# Patient Record
Sex: Male | Born: 1985 | Hispanic: No | Marital: Single | Smoking: Current every day smoker
Health system: Southern US, Community
[De-identification: ages and names within clinical notes are randomized; demographics above are authoritative.]

## PROBLEM LIST (undated history)

## (undated) DIAGNOSIS — F1020 Alcohol dependence, uncomplicated: Secondary | ICD-10-CM

## (undated) DIAGNOSIS — F419 Anxiety disorder, unspecified: Secondary | ICD-10-CM

## (undated) DIAGNOSIS — E785 Hyperlipidemia, unspecified: Secondary | ICD-10-CM

## (undated) HISTORY — DX: Anxiety disorder, unspecified: F41.9

## (undated) HISTORY — DX: Alcohol dependence, uncomplicated: F10.20

## (undated) HISTORY — DX: Hyperlipidemia, unspecified: E78.5

---

## 2006-12-17 ENCOUNTER — Emergency Department (HOSPITAL_COMMUNITY): Admission: AC | Admit: 2006-12-17 | Discharge: 2006-12-17 | Payer: Self-pay

## 2007-09-22 ENCOUNTER — Emergency Department (HOSPITAL_COMMUNITY): Admission: EM | Admit: 2007-09-22 | Discharge: 2007-09-22 | Payer: Self-pay | Admitting: Emergency Medicine

## 2008-10-05 ENCOUNTER — Emergency Department (HOSPITAL_COMMUNITY): Admission: EM | Admit: 2008-10-05 | Discharge: 2008-10-05 | Payer: Self-pay | Admitting: Emergency Medicine
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
## 2010-10-11 LAB — RAPID STREP SCREEN (MED CTR MEBANE ONLY): Streptococcus, Group A Screen (Direct): NEGATIVE

## 2010-10-16 LAB — CBC
HCT: 41.7
Hemoglobin: 14.9
Platelets: 280
WBC: 11.4 — ABNORMAL HIGH

## 2010-10-16 LAB — I-STAT 8, (EC8 V) (CONVERTED LAB)
Acid-Base Excess: 3 — ABNORMAL HIGH
BUN: 9
Chloride: 103
HCT: 47
Hemoglobin: 16
Operator id: 294341
pH, Ven: 7.443 — ABNORMAL HIGH

## 2010-10-16 LAB — POCT I-STAT CREATININE: Creatinine, Ser: 1.2

## 2010-10-16 LAB — PROTIME-INR: Prothrombin Time: 13
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
## 2015-05-01 ENCOUNTER — Emergency Department (HOSPITAL_COMMUNITY)
Admission: EM | Admit: 2015-05-01 | Discharge: 2015-05-01 | Disposition: A | Discharge status: Home / Self Care | Payer: BLUE CROSS/BLUE SHIELD | Attending: Emergency Medicine | Admitting: Emergency Medicine

## 2015-05-01 ENCOUNTER — Encounter (HOSPITAL_COMMUNITY): Payer: Self-pay | Admitting: Emergency Medicine

## 2015-05-01 ENCOUNTER — Emergency Department (HOSPITAL_COMMUNITY): Payer: BLUE CROSS/BLUE SHIELD

## 2015-05-01 DIAGNOSIS — R079 Chest pain, unspecified: Secondary | ICD-10-CM | POA: Diagnosis not present

## 2015-05-01 DIAGNOSIS — F17210 Nicotine dependence, cigarettes, uncomplicated: Secondary | ICD-10-CM | POA: Diagnosis not present

## 2015-05-01 DIAGNOSIS — Z79899 Other long term (current) drug therapy: Secondary | ICD-10-CM | POA: Insufficient documentation

## 2015-05-01 DIAGNOSIS — R0602 Shortness of breath: Secondary | ICD-10-CM | POA: Diagnosis not present

## 2015-05-01 MED ORDER — NAPROXEN 500 MG PO TABS: 500.0000 mg | ORAL_TABLET | Freq: Two times a day (BID) | ORAL | Status: DC

## 2015-05-01 NOTE — ED Provider Notes (Signed)
CSN: [id number]     Arrival date & time 05/01/15  1121 History   First MD Initiated Contact with Patient 05/01/15 1209     Chief Complaint  Patient presents with  . Cough   The history is provided by the patient. No language interpreter was used.    HPI Comments: Charles Macias is a 30 y.o. male who presents to the Emergency Department with no PMHx complaining of right anterior chest pain radiating to the central chest for the past several days. Pt also reports mild SOB. He states his pain is mild is severity and is worse with cough and deep inspiration. Pt has tried no OTC medications for pain.  Pt denies any fevers, chills, diaphoresis, N/V/D or any other symptoms at this time. He drinks alcohol and smokes cigarettes.   History reviewed. No pertinent past medical history. History reviewed. No pertinent past surgical history. No family history on file. Social History  Substance Use Topics  . Smoking status: Current Every Day Smoker -- 0.50 packs/day    Types: Cigarettes  . Smokeless tobacco: None  . Alcohol Use: No    Review of Systems  Constitutional: Negative for fever, chills and diaphoresis.  Respiratory: Positive for shortness of breath.   Cardiovascular: Positive for chest pain.  Gastrointestinal: Negative for nausea, vomiting and diarrhea.  All other systems reviewed and are negative.  Allergies  Review of patient's allergies indicates no known allergies.  Home Medications   Prior to Admission medications   Medication Sig Start Date End Date Taking? Authorizing Provider  cephALEXin (KEFLEX) 500 MG capsule Take 1 capsule by mouth 4 (four) times daily. Starting 04/27/2015 x 7 days for wound infection. 04/27/15  Yes Historical Provider, MD  silver sulfADIAZINE (SILVADENE) 1 % cream Apply 1 application topically 2 (two) times daily. Starting 04/27/2015 x 10 days for wounds. 04/27/15  Yes Historical Provider, MD  traMADol-acetaminophen (ULTRACET) 37.5-325 MG tablet Take 2  tablets by mouth 4 (four) times daily. Starting 04/27/2015 x 4 days. 04/27/15  Yes Historical Provider, MD  naproxen (NAPROSYN) 500 MG tablet Take 1 tablet (500 mg total) by mouth 2 (two) times daily. 05/01/15   Donnetta HutchingBrian Mckaylee Dimalanta, MD   BP 151/100 mmHg  Pulse 90  Temp(Src) 98.3 F (36.8 C) (Oral)  Resp 18  Ht 5\' 11"  (1.803 m)  Wt 224 lb (101.606 kg)  BMI 31.26 kg/m2  SpO2 100%   Physical Exam  Constitutional: He is oriented to person, place, and time. He appears well-developed and well-nourished.  HENT:  Head: Normocephalic and atraumatic.  Eyes: Conjunctivae and EOM are normal. Pupils are equal, round, and reactive to light.  Neck: Normal range of motion. Neck supple.  Cardiovascular: Normal rate and regular rhythm.   Pulmonary/Chest: Effort normal and breath sounds normal.  Abdominal: Soft. Bowel sounds are normal.  Musculoskeletal: Normal range of motion.  Neurological: He is alert and oriented to person, place, and time.  Skin: Skin is warm and dry.  Psychiatric: He has a normal mood and affect. His behavior is normal.  Nursing note and vitals reviewed.   ED Course  Procedures  DIAGNOSTIC STUDIES: Oxygen Saturation is 100% on room air, normal by my interpretation.    COORDINATION OF CARE: 12:56 PM Will order CXR and EKG for low risk ACS and PE. Discussed treatment plan with pt at bedside and pt agreed to plan.  Labs Review Labs Reviewed - No data to display  Imaging Review Dg Chest 2 View  05/01/2015  CLINICAL  DATA:  Cough x3 days, right chest tightness EXAM: CHEST  2 VIEW COMPARISON:  10/05/2008 FINDINGS: Lungs are clear.  No pleural effusion or pneumothorax. The heart is normal in size. Visualized osseous structures are within normal limits. IMPRESSION: No evidence of acute cardiopulmonary disease. Electronically Signed   By: Charline Bills M.D.   On: 05/01/2015 12:04   I have personally reviewed and evaluated these images and lab results as part of my medical  decision-making.   EKG Interpretation   Date/Time:  Sunday May 01 2015 13:14:31 EDT Ventricular Rate:  83 PR Interval:  180 QRS Duration: 85 QT Interval:  369 QTC Calculation: 434 R Axis:   61 Text Interpretation:  Sinus rhythm ST elev, probable normal early repol  pattern Confirmed by Lachrisha Ziebarth  MD, Catherene Kaleta ([id number]) on 05/01/2015 1:56:59 PM      MDM   Final diagnoses:  Chest pain, unspecified chest pain type    Patient is low risk for ACS or PE. EKG and chest x-ray negative. Vital signs stable. Rx Naprosyn 500 mg.  I personally performed the services described in this documentation, which was scribed in my presence. The recorded information has been reviewed and is accurate.     Donnetta Hutching, MD 05/01/15 1359

## 2015-05-01 NOTE — Progress Notes (Signed)
Discharged PT per MD order and protocol. Discharge handouts reviewed/explained. Education completed.  Pt verbalized understanding and left with all belongings. VSS. IV catheter D/C.  Patient wheeled down by staff member.  

## 2015-05-01 NOTE — Discharge Instructions (Signed)
Tests showed no life-threatening condition. Medication for pain. Return if worse.

## 2015-05-01 NOTE — ED Notes (Addendum)
Pt reports non-productive cough, chest congestion/tightness, and mild SOB. Denies fever/chills. Pt states he saw PCP on Wednesday and has been taking antibiotic. States symptoms have worsened.

## 2017-03-07 ENCOUNTER — Encounter: Payer: Self-pay | Admitting: Cardiovascular Disease

## 2017-03-13 ENCOUNTER — Encounter: Payer: Self-pay | Admitting: Cardiovascular Disease

## 2017-03-13 ENCOUNTER — Ambulatory Visit (INDEPENDENT_AMBULATORY_CARE_PROVIDER_SITE_OTHER): Payer: BLUE CROSS/BLUE SHIELD | Admitting: Cardiovascular Disease

## 2017-03-13 VITALS — BP 144/86 | HR 94 | Ht 71.0 in | Wt 235.0 lb

## 2017-03-13 DIAGNOSIS — Z716 Tobacco abuse counseling: Secondary | ICD-10-CM

## 2017-03-13 DIAGNOSIS — Z7182 Exercise counseling: Secondary | ICD-10-CM | POA: Diagnosis not present

## 2017-03-13 DIAGNOSIS — Z136 Encounter for screening for cardiovascular disorders: Secondary | ICD-10-CM

## 2017-03-13 DIAGNOSIS — R030 Elevated blood-pressure reading, without diagnosis of hypertension: Secondary | ICD-10-CM | POA: Diagnosis not present

## 2017-03-13 MED ORDER — VARENICLINE TARTRATE 0.5 MG X 11 & 1 MG X 42 PO MISC: ORAL | 0 refills | Status: DC

## 2017-03-13 NOTE — Progress Notes (Signed)
CARDIOLOGY CONSULT NOTE  Patient ID: Charles Macias MRN: [id number] DOB/AGE: [birth year] 32 y.o.  Admit date: (Not on file) Primary Physician: Ginger Organ Referring Physician: Ginger Organ  Reason for Consultation: Family history of heart disease  HPI: Charles Macias is a 32 y.o. male who is being seen today for the evaluation of family history of heart disease at the request of Cory Munch, PA-C.   I reviewed office notes.  It appears his father has a history of MI with stent and CABG by the age of 63.  I reviewed labs performed on 02/18/17: Hemoglobin 15.1, BUN 10, creatinine 0.86, sodium 137, potassium 4.9, total cholesterol 201, triglycerides 55, HDL 64, LDL 126.  He is not sure what his blood pressure normally runs but it is mildly elevated at 144/86 today.  He smokes 1/2 pack of cigarettes daily and has done so for at least 10 years.  He said he wants to begin exercising but he has been afraid to do so.  He is engaged and his fiance cooks and purchases very healthy food including chicken, fish, and fresh fruit and vegetables.  He very seldom has episodic chest pains at rest lasting for a second.  He has occasional exertional dyspnea.  He denies palpitations, leg swelling, orthopnea, paroxysmal nocturnal dyspnea, lightheadedness, dizziness, and syncope.  I ordered an ECG which I personally reviewed today in the office which demonstrates sinus rhythm with no ischemic ST segment or T wave abnormalities, nor any arrhythmias.  There is voltage criteria for LVH.  Family history: Father had 2 MIs and ultimately had a stent and underwent CABG at the age of 11.  He was a non-smoker.  His paternal grandfather had an MI at age 76 and is currently 27.    No Known Allergies  Current Outpatient Medications  Medication Sig Dispense Refill  . varenicline (CHANTIX STARTING MONTH PAK) 0.5 MG X 11 & 1 MG X 42 tablet Take one 0.5 mg tablet by mouth  once daily for 3 days, then increase to one 0.5 mg tablet twice daily for 4 days, then increase to one 1 mg tablet twice daily. 53 tablet 0   No current facility-administered medications for this visit.     History reviewed. No pertinent past medical history.  History reviewed. No pertinent surgical history.  Social History   Socioeconomic History  . Marital status: Single    Spouse name: Not on file  . Number of children: Not on file  . Years of education: Not on file  . Highest education level: Not on file  Social Needs  . Financial resource strain: Not on file  . Food insecurity - worry: Not on file  . Food insecurity - inability: Not on file  . Transportation needs - medical: Not on file  . Transportation needs - non-medical: Not on file  Occupational History  . Not on file  Tobacco Use  . Smoking status: Current Every Day Smoker    Packs/day: 0.50    Types: Cigarettes  . Smokeless tobacco: Never Used  Substance and Sexual Activity  . Alcohol use: No  . Drug use: No  . Sexual activity: Not on file  Other Topics Concern  . Not on file  Social History Narrative  . Not on file     No outpatient medications have been marked as taking for the 03/13/17 encounter (Office Visit) with Herminio Commons, MD.  Review of systems complete and found to be negative unless listed above in HPI    Physical exam Blood pressure (!) 144/86, pulse 94, height '5\' 11"'$  (1.803 m), weight 235 lb (106.6 kg), SpO2 96 %. General: NAD Neck: No JVD, no thyromegaly or thyroid nodule.  Lungs: Clear to auscultation bilaterally with normal respiratory effort. CV: Nondisplaced PMI. Regular rate and rhythm, normal S1/S2, no S3/S4, no murmur.  No peripheral edema.  No carotid bruit.    Abdomen: Soft, nontender, no distention.  Skin: Intact without lesions or rashes.  Neurologic: Alert and oriented x 3.  Psych: Normal affect. Extremities: No clubbing or cyanosis.  HEENT: Normal.   ECG:  Most recent ECG reviewed.   Labs: Lab Results  Component Value Date/Time   K 3.1 (L) 12/17/2006 12:21 AM   BUN 9 12/17/2006 12:21 AM   CREATININE 1.2 12/17/2006 12:21 AM   HGB 16.0 12/17/2006 12:21 AM     Lipids: No results found for: LDLCALC, LDLDIRECT, CHOL, TRIG, HDL      ASSESSMENT AND PLAN:  1.  Primary prevention of cardiovascular disease: We spoke at length about exercise modification and tobacco cessation.  I also told him to monitor his blood pressure as it is elevated today and if it remains consistently elevated even after lifestyle modification for at least 3 months, he may require antihypertensive therapy.  His lipids have been reviewed above and at present, he does not require statin therapy as LDL is less than 190.  It appears he eats a very healthy diet.  2.  Elevated blood pressure without a diagnosis of hypertension:  I told him to monitor his blood pressure as it is elevated today and if it remains consistently elevated even after lifestyle modification for at least 3 months, he may require antihypertensive therapy.  If this occurs, I would consider echocardiography to evaluate for hypertensive heart disease given the possible LVH seen by ECG.  3.  Tobacco use: Cessation counseling provided (3 minutes).  I will prescribe a Chantix starter kit.     Disposition: Follow up prn.   Signed: Kate Sable, M.D., F.A.C.C.  03/13/2017, 2:54 PM

## 2017-03-13 NOTE — Patient Instructions (Signed)
Medication Instructions:  START CHANTIX - TAKE AS DIRECTED   Labwork: NONE  Testing/Procedures: NONE  Follow-Up: Your physician recommends that you schedule a follow-up appointment in: AS NEEDED    Any Other Special Instructions Will Be Listed Below (If Applicable).     If you need a refill on your cardiac medications before your next appointment, please call your pharmacy.

## 2017-04-08 ENCOUNTER — Other Ambulatory Visit: Payer: Self-pay | Admitting: Cardiovascular Disease

## 2017-04-16 ENCOUNTER — Other Ambulatory Visit: Payer: Self-pay

## 2017-04-16 MED ORDER — VARENICLINE TARTRATE 1 MG PO TABS: 1.0000 mg | ORAL_TABLET | Freq: Two times a day (BID) | ORAL | 2 refills | Status: DC

## 2017-04-16 NOTE — Telephone Encounter (Signed)
refilled continuing month chantix 1 mg BID 30 day with RF:2

## 2017-12-18 IMAGING — DX DG CHEST 2V
2 series · 3 of 3 positions shown · non-contrast
Comparison: 10/05/2008

CLINICAL DATA: Cough x3 days, right chest tightness

EXAM:
CHEST  2 VIEW

[chest pa]
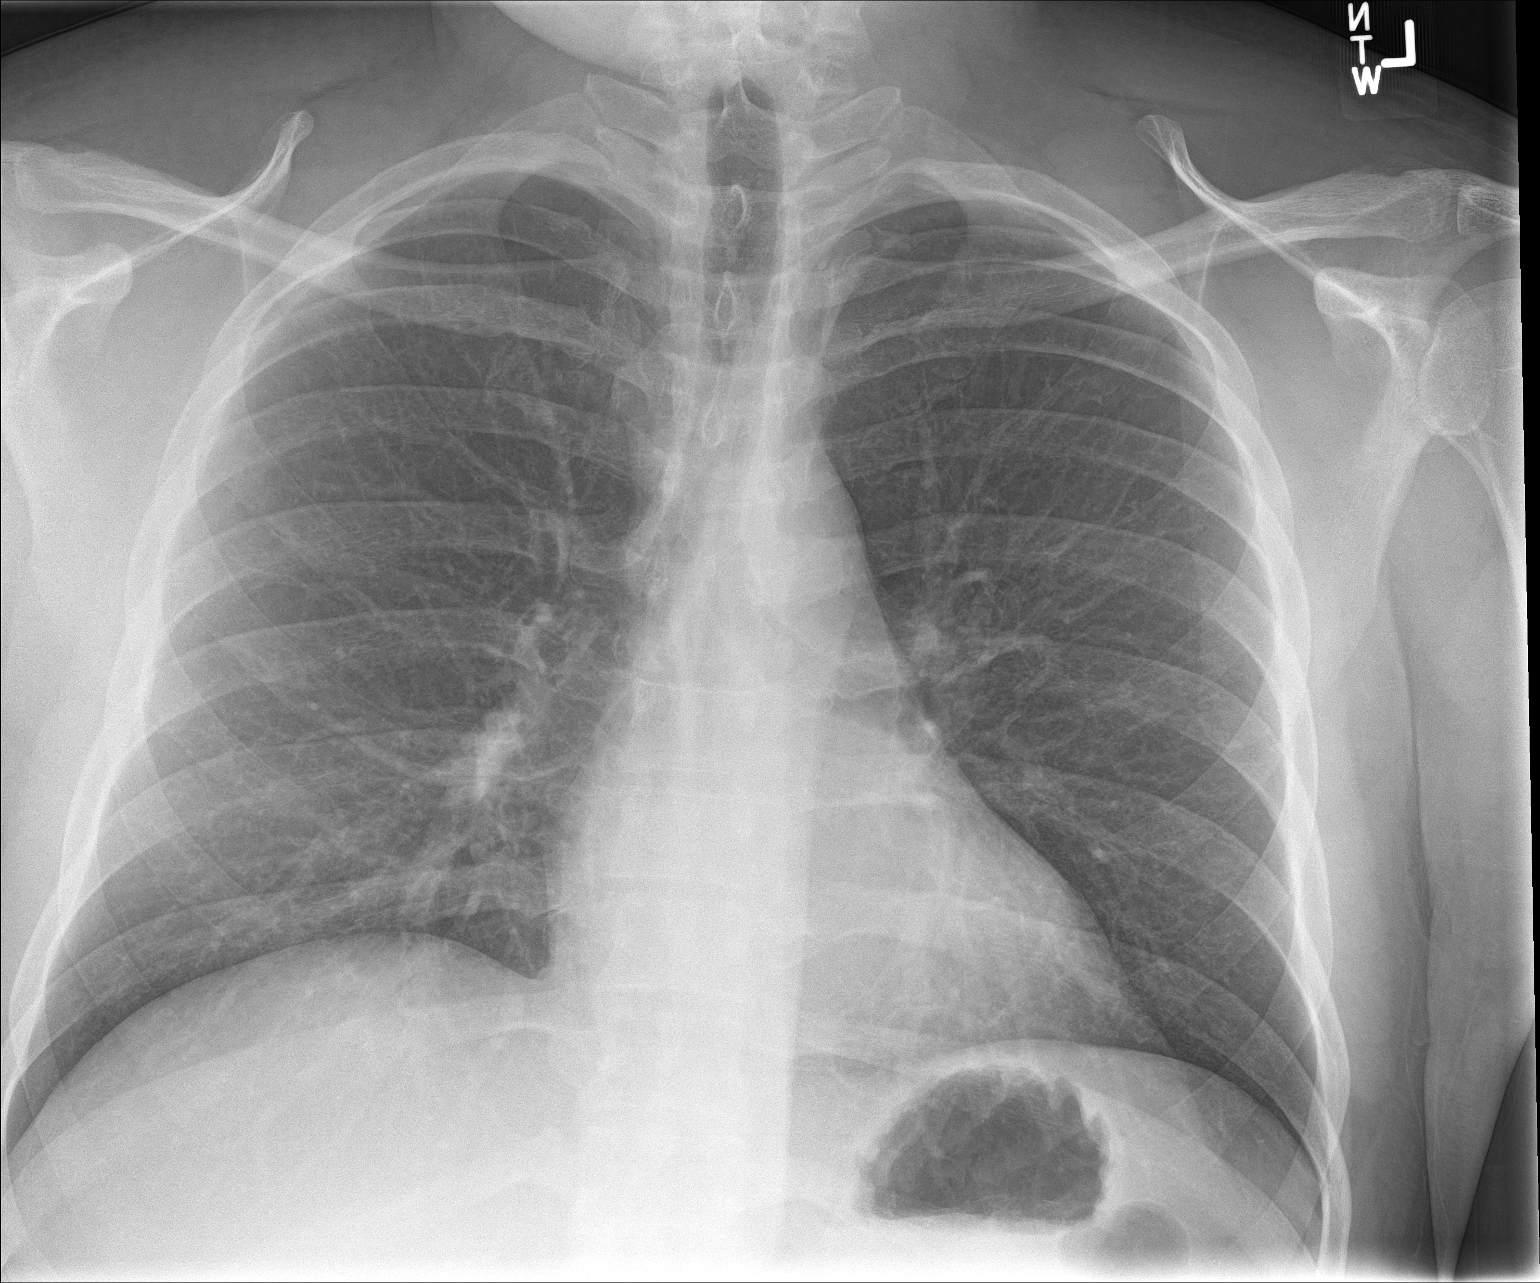

[Series 2: chest lat · 0.14mm/px · 2 of 2 slices shown]
[im 1/2]
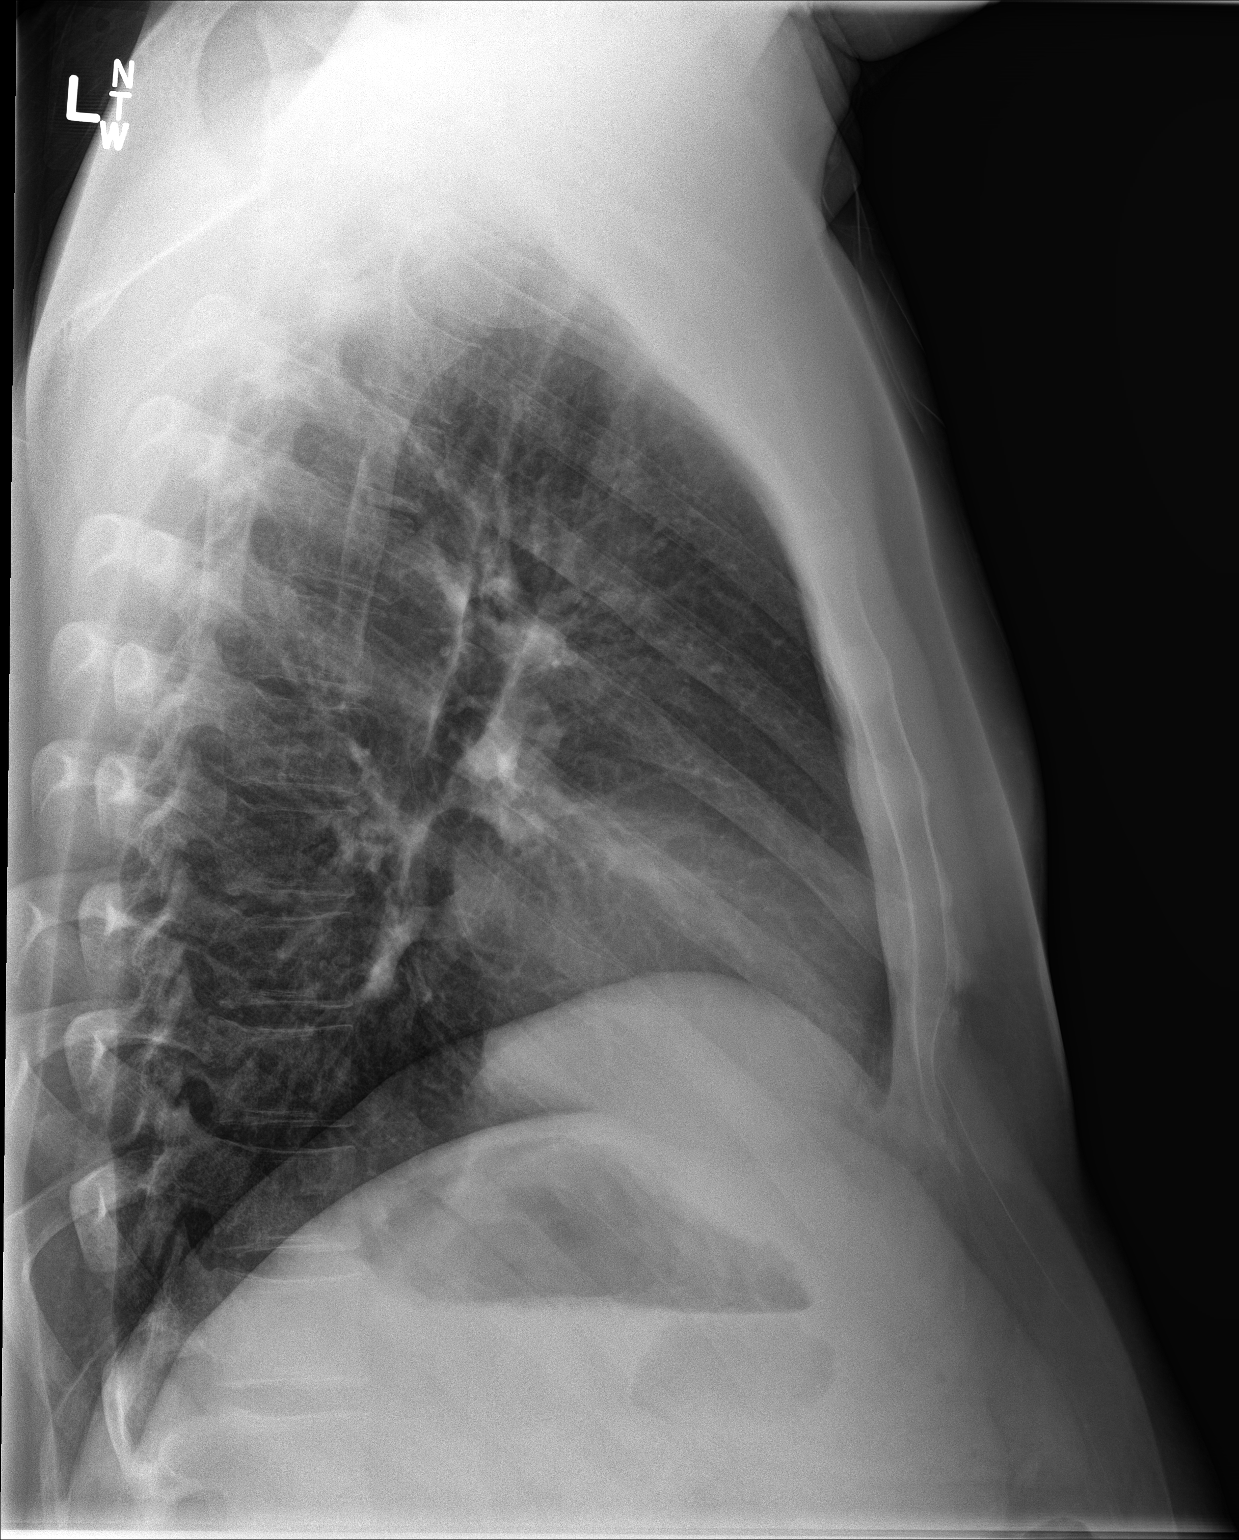
[im 2/2]
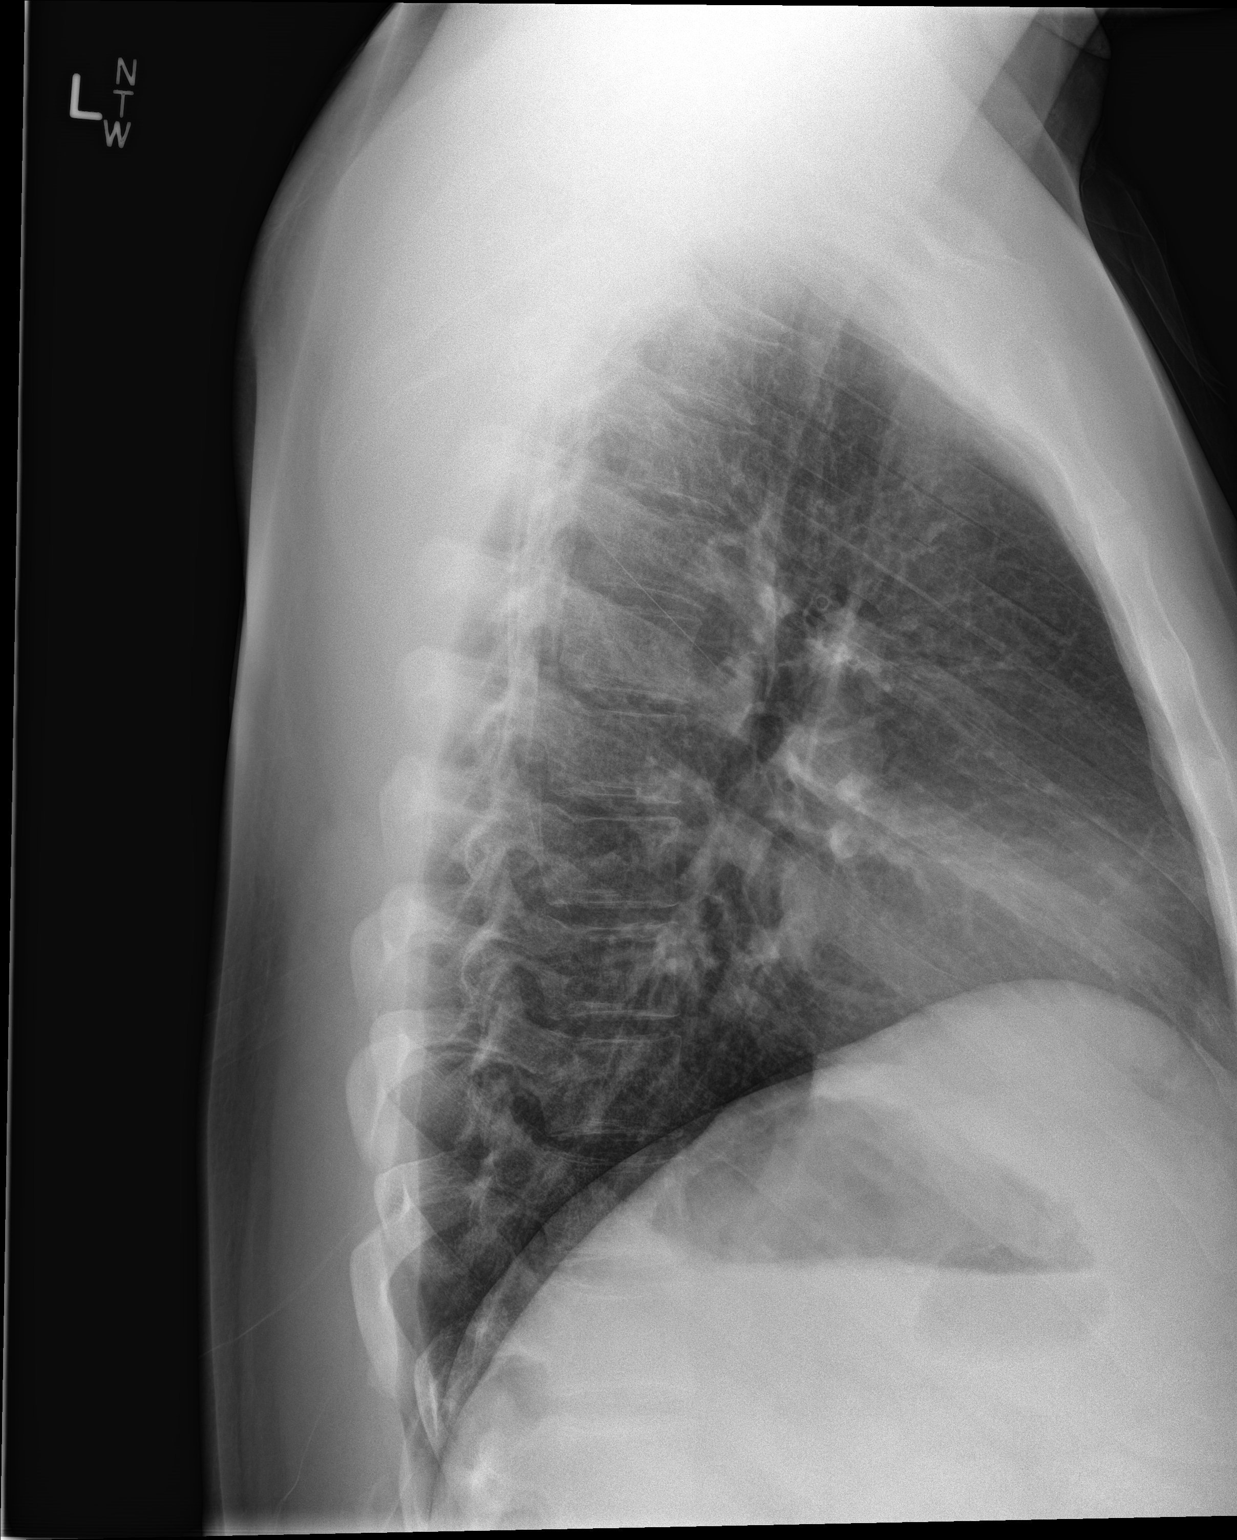

[3 of 3 positions shown; findings below may reference images not displayed]

FINDINGS: Lungs are clear.  No pleural effusion or pneumothorax.

The heart is normal in size.

Visualized osseous structures are within normal limits.
IMPRESSION: No evidence of acute cardiopulmonary disease.

## 2019-08-29 ENCOUNTER — Other Ambulatory Visit: Payer: Self-pay

## 2019-08-29 ENCOUNTER — Encounter (HOSPITAL_COMMUNITY): Payer: Self-pay

## 2019-08-29 ENCOUNTER — Emergency Department (HOSPITAL_COMMUNITY)
Admission: EM | Admit: 2019-08-29 | Discharge: 2019-08-29 | Disposition: A | Discharge status: Home / Self Care | Payer: BLUE CROSS/BLUE SHIELD | Attending: Emergency Medicine | Admitting: Emergency Medicine

## 2019-08-29 DIAGNOSIS — F17210 Nicotine dependence, cigarettes, uncomplicated: Secondary | ICD-10-CM | POA: Insufficient documentation

## 2019-08-29 DIAGNOSIS — R05 Cough: Secondary | ICD-10-CM | POA: Insufficient documentation

## 2019-08-29 DIAGNOSIS — R0981 Nasal congestion: Secondary | ICD-10-CM | POA: Insufficient documentation

## 2019-08-29 DIAGNOSIS — Z79899 Other long term (current) drug therapy: Secondary | ICD-10-CM | POA: Insufficient documentation

## 2019-08-29 DIAGNOSIS — K122 Cellulitis and abscess of mouth: Secondary | ICD-10-CM | POA: Insufficient documentation

## 2019-08-29 MED ORDER — DEXAMETHASONE 4 MG PO TABS: 4.0000 mg | ORAL_TABLET | Freq: Two times a day (BID) | ORAL | 0 refills | Status: DC

## 2019-08-29 MED ORDER — FEXOFENADINE-PSEUDOEPHED ER 60-120 MG PO TB12: 1.0000 | ORAL_TABLET | Freq: Two times a day (BID) | ORAL | 0 refills | Status: DC | PRN

## 2019-08-29 NOTE — ED Triage Notes (Signed)
Pt reports nasal congestion and chest congestion for about a week. Pt reports tenderness to right side of face . Throat is sore . Clear productive cough. Pt reports he has had 2 covid test and test have been negative. Negative results yesterday from [facility]

## 2019-09-02 NOTE — ED Provider Notes (Signed)
[facility] EMERGENCY DEPARTMENT Provider Note   CSN: [id number] Arrival date & time: 08/29/19  7829     History Chief Complaint  Patient presents with  . Nasal Congestion  . Sore Throat    Charles Macias is a 34 y.o. male.  HPI   34 year old male with cough and congestion.  Onset about a week ago.  Persistent since then.  Feels he constantly needs to clear his throat.  Throat feels stuffy/congested.  No fevers.  Reports recent negative Covid testing.  History reviewed. No pertinent past medical history.  There are no problems to display for this patient.   History reviewed. No pertinent surgical history.     Family History  Problem Relation Age of Onset  . COPD Mother   . Lupus Mother   . Heart attack Father   . Heart attack Paternal Grandfather     Social History   Tobacco Use  . Smoking status: Current Every Day Smoker    Packs/day: 0.50    Types: Cigarettes  . Smokeless tobacco: Never Used  Vaping Use  . Vaping Use: Never used  Substance Use Topics  . Alcohol use: Yes  . Drug use: No    Home Medications Prior to Admission medications   Medication Sig Start Date End Date Taking? Authorizing Provider  dexamethasone (DECADRON) 4 MG tablet Take 1 tablet (4 mg total) by mouth 2 (two) times daily. 08/29/19   Raeford Razor, MD  fexofenadine-pseudoephedrine (ALLEGRA-D) 60-120 MG 12 hr tablet Take 1 tablet by mouth 2 (two) times daily as needed. 08/29/19   Raeford Razor, MD  varenicline (CHANTIX CONTINUING MONTH PAK) 1 MG tablet Take 1 tablet (1 mg total) by mouth 2 (two) times daily. 04/16/17   Laqueta Linden, MD  varenicline (CHANTIX STARTING MONTH PAK) 0.5 MG X 11 & 1 MG X 42 tablet Take one 0.5 mg tablet by mouth once daily for 3 days, then increase to one 0.5 mg tablet twice daily for 4 days, then increase to one 1 mg tablet twice daily. 03/13/17   Laqueta Linden, MD    Allergies    Patient has no known allergies.  Review of Systems    Review of Systems All systems reviewed and negative, other than as noted in HPI.  Physical Exam Updated Vital Signs BP (!) 145/100 (BP Location: Right Arm)   Pulse 81   Temp 98.1 F (36.7 C) (Oral)   Resp 18   Ht 5\' 11"  (1.803 m)   Wt 94.3 kg   SpO2 99%   BMI 29.01 kg/m   Physical Exam Vitals and nursing note reviewed.  Constitutional:      General: He is not in acute distress.    Appearance: He is well-developed.  HENT:     Head: Normocephalic and atraumatic.     Mouth/Throat:     Comments: Uvula midline but edematous/enlarged.  Oropharynx is otherwise clear.  Normal sounding voice.  Lungs clear bilaterally. Eyes:     General:        Right eye: No discharge.        Left eye: No discharge.     Conjunctiva/sclera: Conjunctivae normal.  Cardiovascular:     Rate and Rhythm: Normal rate and regular rhythm.     Heart sounds: Normal heart sounds. No murmur heard.  No friction rub. No gallop.   Pulmonary:     Effort: Pulmonary effort is normal. No respiratory distress.     Breath sounds: Normal breath sounds.  Abdominal:     General: There is no distension.     Palpations: Abdomen is soft.     Tenderness: There is no abdominal tenderness.  Musculoskeletal:        General: No tenderness.     Cervical back: Neck supple.  Skin:    General: Skin is warm and dry.  Neurological:     Mental Status: He is alert.  Psychiatric:        Behavior: Behavior normal.        Thought Content: Thought content normal.     ED Results / Procedures / Treatments   Labs (all labs ordered are listed, but only abnormal results are displayed) Labs Reviewed - No data to display  EKG None  Radiology No results found.  Procedures Procedures (including critical care time)  Medications Ordered in ED Medications - No data to display  ED Course  I have reviewed the triage vital signs and the nursing notes.  Pertinent labs & imaging results that were available during my care of the  patient were reviewed by me and considered in my medical decision making (see chart for details).    MDM Rules/Calculators/A&P                         34 year old male with congestion, throat discomfort.  Uvulitis on exam.  A midline but edematous.  Normal sounding voice.  Lungs clear.  Plan decongestants.  Steroids.  Continue as needed NSAIDs.  Return precautions discussed. Final Clinical Impression(s) / ED Diagnoses Final diagnoses:  Uvulitis    Rx / DC Orders ED Discharge Orders         Ordered    dexamethasone (DECADRON) 4 MG tablet  2 times daily        08/29/19 0857    fexofenadine-pseudoephedrine (ALLEGRA-D) 60-120 MG 12 hr tablet  2 times daily PRN        08/29/19 0857           Raeford Razor, MD 09/02/19 1050

## 2021-01-22 ENCOUNTER — Ambulatory Visit
Admission: EM | Admit: 2021-01-22 | Discharge: 2021-01-22 | Disposition: A | Discharge status: Home / Self Care | Payer: BC Managed Care – PPO | Attending: Family Medicine | Admitting: Family Medicine

## 2021-01-22 ENCOUNTER — Other Ambulatory Visit: Payer: Self-pay

## 2021-01-22 ENCOUNTER — Encounter: Payer: Self-pay | Admitting: Emergency Medicine

## 2021-01-22 DIAGNOSIS — R042 Hemoptysis: Secondary | ICD-10-CM | POA: Diagnosis not present

## 2021-01-22 DIAGNOSIS — R000 Tachycardia, unspecified: Secondary | ICD-10-CM | POA: Diagnosis not present

## 2021-01-22 DIAGNOSIS — R509 Fever, unspecified: Secondary | ICD-10-CM

## 2021-01-22 DIAGNOSIS — J039 Acute tonsillitis, unspecified: Secondary | ICD-10-CM | POA: Diagnosis not present

## 2021-01-22 MED ORDER — LIDOCAINE VISCOUS HCL 2 % MT SOLN: 10.0000 mL | OROMUCOSAL | 0 refills | Status: DC | PRN

## 2021-01-22 MED ORDER — AMOXICILLIN 875 MG PO TABS: 875.0000 mg | ORAL_TABLET | Freq: Two times a day (BID) | ORAL | 0 refills | Status: DC

## 2021-01-22 MED ORDER — DEXAMETHASONE SODIUM PHOSPHATE 10 MG/ML IJ SOLN
10.0000 mg | Freq: Once | INTRAMUSCULAR | Status: AC
  Administered 2021-01-22: 10 mg via INTRAMUSCULAR

## 2021-01-22 NOTE — ED Provider Notes (Signed)
[facility]    CSN: [id number] Arrival date & time: 01/22/21  0806      History   Chief Complaint Chief Complaint  Patient presents with   Cough    HPI Charles Macias is a 36 y.o. male.   Presenting today with 6-day history of fever, weakness, body aches, sore, swollen throat, pain with swallowing, cough with occasional bloody sputum.  Denies chest pain, shortness of breath, abdominal pain, nausea vomiting or diarrhea.  No known sick contacts recently.  Has been trying over-the-counter cold and congestion medication with no relief.  No known pertinent chronic medical problems.   History reviewed. No pertinent past medical history.  There are no problems to display for this patient.   History reviewed. No pertinent surgical history.     Home Medications    Prior to Admission medications   Medication Sig Start Date End Date Taking? Authorizing Provider  amoxicillin (AMOXIL) 875 MG tablet Take 1 tablet (875 mg total) by mouth 2 (two) times daily. 01/22/21  Yes Particia Nearing, PA-C  lidocaine (XYLOCAINE) 2 % solution Use as directed 10 mLs in the mouth or throat every 3 (three) hours as needed for mouth pain. 01/22/21  Yes Particia Nearing, PA-C  dexamethasone (DECADRON) 4 MG tablet Take 1 tablet (4 mg total) by mouth 2 (two) times daily. 08/29/19   Raeford Razor, MD  fexofenadine-pseudoephedrine (ALLEGRA-D) 60-120 MG 12 hr tablet Take 1 tablet by mouth 2 (two) times daily as needed. 08/29/19   Raeford Razor, MD  varenicline (CHANTIX CONTINUING MONTH PAK) 1 MG tablet Take 1 tablet (1 mg total) by mouth 2 (two) times daily. 04/16/17   Laqueta Linden, MD  varenicline (CHANTIX STARTING MONTH PAK) 0.5 MG X 11 & 1 MG X 42 tablet Take one 0.5 mg tablet by mouth once daily for 3 days, then increase to one 0.5 mg tablet twice daily for 4 days, then increase to one 1 mg tablet twice daily. 03/13/17   Laqueta Linden, MD    Family History Family  History  Problem Relation Age of Onset   COPD Mother    Lupus Mother    Heart attack Father    Heart attack Paternal Grandfather     Social History Social History   Tobacco Use   Smoking status: Every Day    Packs/day: 0.25    Types: Cigarettes   Smokeless tobacco: Never  Vaping Use   Vaping Use: Never used  Substance Use Topics   Alcohol use: Yes    Comment: beer and liquor on weekends   Drug use: No     Allergies   Patient has no known allergies.   Review of Systems Review of Systems Per HPI  Physical Exam Triage Vital Signs ED Triage Vitals [01/22/21 0813]  Enc Vitals Group     BP (!) 150/104     Pulse Rate (!) 112     Resp 18     Temp 98 F (36.7 C)     Temp Source Oral     SpO2 97 %     Weight 235 lb (106.6 kg)     Height 5\' 11"  (1.803 m)     Head Circumference      Peak Flow      Pain Score 7     Pain Loc      Pain Edu?      Excl. in GC?    No data found.  Updated Vital Signs  BP (!) 150/104 (BP Location: Right Arm)    Pulse (!) 112    Temp 98 F (36.7 C) (Oral)    Resp 18    Ht 5\' 11"  (1.803 m)    Wt 235 lb (106.6 kg)    SpO2 97%    BMI 32.78 kg/m   Visual Acuity Right Eye Distance:   Left Eye Distance:   Bilateral Distance:    Right Eye Near:   Left Eye Near:    Bilateral Near:     Physical Exam Vitals and nursing note reviewed.  Constitutional:      Appearance: He is well-developed.  HENT:     Head: Atraumatic.     Right Ear: External ear normal.     Left Ear: External ear normal.     Nose: Nose normal.     Mouth/Throat:     Mouth: Mucous membranes are moist.     Pharynx: Posterior oropharyngeal erythema present. No oropharyngeal exudate.     Comments: Significant tonsillar edema, erythema, worse on the right.  Uvula midline, oral airway patent on the left but partially occluded on the right.  No obvious exudates noted on exam, no abscesses noted Eyes:     Conjunctiva/sclera: Conjunctivae normal.     Pupils: Pupils are equal,  round, and reactive to light.  Cardiovascular:     Rate and Rhythm: Normal rate and regular rhythm.  Pulmonary:     Effort: Pulmonary effort is normal. No respiratory distress.     Breath sounds: No wheezing or rales.  Abdominal:     General: Bowel sounds are normal. There is no distension.     Palpations: Abdomen is soft.     Tenderness: There is no abdominal tenderness. There is no guarding.  Musculoskeletal:        General: Normal range of motion.     Cervical back: Normal range of motion and neck supple.  Lymphadenopathy:     Cervical: Cervical adenopathy present.  Skin:    General: Skin is warm and dry.  Neurological:     Mental Status: He is alert and oriented to person, place, and time.  Psychiatric:        Behavior: Behavior normal.     UC Treatments / Results  Labs (all labs ordered are listed, but only abnormal results are displayed) Labs Reviewed - No data to display  EKG   Radiology No results found.  Procedures Procedures (including critical care time)  Medications Ordered in UC Medications  dexamethasone (DECADRON) injection 10 mg (10 mg Intramuscular Given 01/22/21 0843)    Initial Impression / Assessment and Plan / UC Course  I have reviewed the triage vital signs and the nursing notes.  Pertinent labs & imaging results that were available during my care of the patient were reviewed by me and considered in my medical decision making (see chart for details).     Given duration and worsening course of symptoms, tachycardia on exam will treat for bacterial tonsillitis with amoxicillin, viscous lidocaine, IM Decadron given extent of edema.  We will forego POC testing today as already treating in case bacterial.  He took a home COVID test prior in symptoms that was negative. Return for worsening symptoms.  Work note given.  Final Clinical Impressions(s) / UC Diagnoses   Final diagnoses:  Acute tonsillitis, unspecified etiology  Fever, unspecified   Hemoptysis  Tachycardia   Discharge Instructions   None    ED Prescriptions     Medication Sig  Dispense Auth. Provider   amoxicillin (AMOXIL) 875 MG tablet Take 1 tablet (875 mg total) by mouth 2 (two) times daily. 20 tablet Particia NearingLane, Jalyric Kaestner Elizabeth, PA-C   lidocaine (XYLOCAINE) 2 % solution Use as directed 10 mLs in the mouth or throat every 3 (three) hours as needed for mouth pain. 100 mL Particia NearingLane, Wadie Mattie [place], [place]-C      PDMP not reviewed this encounter.   Roosvelt MaserLane, Mateo Overbeck Double [place], [place]-C 01/22/21 [phone]

## 2021-01-22 NOTE — ED Triage Notes (Addendum)
Pt reports cough, sore throat, intermittent fever since Tuesday. Pt also reports coughing up blood x2 days. Airway patent. Red,enlarged tonsils upon assessment. Nad noted.

## 2021-04-12 ENCOUNTER — Telehealth: Payer: BC Managed Care – PPO | Admitting: Physician Assistant

## 2021-04-12 DIAGNOSIS — U071 COVID-19: Secondary | ICD-10-CM

## 2021-04-12 MED ORDER — FLUTICASONE PROPIONATE 50 MCG/ACT NA SUSP: 2.0000 | Freq: Every day | NASAL | 0 refills | Status: DC

## 2021-04-12 MED ORDER — ALBUTEROL SULFATE HFA 108 (90 BASE) MCG/ACT IN AERS: 2.0000 | INHALATION_SPRAY | Freq: Four times a day (QID) | RESPIRATORY_TRACT | 0 refills | Status: DC | PRN

## 2021-04-12 MED ORDER — BENZONATATE 100 MG PO CAPS: 100.0000 mg | ORAL_CAPSULE | Freq: Three times a day (TID) | ORAL | 0 refills | Status: DC | PRN

## 2021-04-12 NOTE — Progress Notes (Signed)
E-Visit for Positive Covid Test Result We are sorry you are not feeling well. We are here to help!  You have tested positive for COVID-19, meaning that you were infected with the novel coronavirus and could give the virus to others.  It is vitally important that you stay home so you do not spread it to others.      Please continue isolation at home, for at least 10 days since the start of your symptoms and until you have had 24 hours with no fever (without taking a fever reducer) and with improving of symptoms.  If you have no symptoms but tested positive (or all symptoms resolve after 5 days and you have no fever) you can leave your house but continue to wear a mask around others for an additional 5 days. If you have a fever,continue to stay home until you have had 24 hours of no fever. Most cases improve 5-10 days from onset but we have seen a small number of patients who have gotten worse after the 10 days.  Please be sure to watch for worsening symptoms and remain taking the proper precautions.   Go to the nearest hospital ED for assessment if fever/cough/breathlessness are severe or illness seems like a threat to life.    The following symptoms may appear 2-14 days after exposure: Fever Cough Shortness of breath or difficulty breathing Chills Repeated shaking with chills Muscle pain Headache Sore throat New loss of taste or smell Fatigue Congestion or runny nose Nausea or vomiting Diarrhea  You have been enrolled in MyChart Home Monitoring for COVID-19. Daily you will receive a questionnaire within the MyChart website. Our COVID-19 response team will be monitoring your responses daily.  You can use medication such as prescription cough medication called Tessalon Perles 100 mg. You may take 1-2 capsules every 8 hours as needed for cough,  prescription inhaler called Albuterol MDI 90 mcg /actuation 2 puffs every 4 hours as needed for shortness of breath, wheezing, cough, and  prescription for Fluticasone nasal spray 2 sprays in each nostril one time per day  You may also take acetaminophen (Tylenol) as needed for fever.  HOME CARE: Only take medications as instructed by your medical team. Drink plenty of fluids and get plenty of rest. A steam or ultrasonic humidifier can help if you have congestion.   GET HELP RIGHT AWAY IF YOU HAVE EMERGENCY WARNING SIGNS.  Call 911 or proceed to your closest emergency facility if: You develop worsening high fever. Trouble breathing Bluish lips or face Persistent pain or pressure in the chest New confusion Inability to wake or stay awake You cough up blood. Your symptoms become more severe Inability to hold down food or fluids  This list is not all possible symptoms. Contact your medical provider for any symptoms that are severe or concerning to you.    Your e-visit answers were reviewed by a board certified advanced clinical practitioner to complete your personal care plan.  Depending on the condition, your plan could have included both over the counter or prescription medications.  If there is a problem please reply once you have received a response from your provider.  Your safety is important to us.  If you have drug allergies check your prescription carefully.    You can use MyChart to ask questions about today's visit, request a non-urgent call back, or ask for a work or school excuse for 24 hours related to this e-Visit. If it has been greater than 24   hours you will need to follow up with your provider, or enter a new e-Visit to address those concerns. You will get an e-mail in the next two days asking about your experience.  I hope that your e-visit has been valuable and will speed your recovery. Thank you for using e-visits.   I provided 5 minutes of non face-to-face time during this encounter for chart review and documentation.   

## 2021-04-13 ENCOUNTER — Ambulatory Visit: Payer: BLUE CROSS/BLUE SHIELD | Admitting: Family Medicine

## 2021-04-16 ENCOUNTER — Telehealth: Payer: BLUE CROSS/BLUE SHIELD | Admitting: Physician Assistant

## 2021-04-16 DIAGNOSIS — U071 COVID-19: Secondary | ICD-10-CM

## 2021-04-16 NOTE — Progress Notes (Signed)
? ? ?E-Visit  for Positive Covid Test Result ? ?We are sorry you are not feeling well. We are here to help! ? ?You have tested positive for COVID-19, meaning that you were infected with the novel coronavirus and could give the virus to others.  It is vitally important that you stay home so you do not spread it to others.     ? ?Please continue isolation at home, for at least 10 days since the start of your symptoms and until you have had 24 hours with no fever (without taking a fever reducer) and with improving of symptoms.  If you have no symptoms but tested positive (or all symptoms resolve after 5 days and you have no fever) you can leave your house but continue to wear a mask around others for an additional 5 days. If you have a fever,continue to stay home until you have had 24 hours of no fever. Most cases improve 5-10 days from onset but we have seen a small number of patients who have gotten worse after the 10 days.  Please be sure to watch for worsening symptoms and remain taking the proper precautions.  ? ?Go to the nearest hospital ED for assessment if fever/cough/breathlessness are severe or illness seems like a threat to life.   ? ?The following symptoms may appear 2-14 days after exposure: ?Fever ?Cough ?Shortness of breath or difficulty breathing ?Chills ?Repeated shaking with chills ?Muscle pain ?Headache ?Sore throat ?New loss of taste or smell ?Fatigue ?Congestion or runny nose ?Nausea or vomiting ?Diarrhea ? ?You have been enrolled in [place] for COVID-19. Daily you will receive a questionnaire within the [facility] website. Our COVID-19 response team will be monitoring your responses daily. ? ?You can use medication such as continue to use those already prescribed.  ? ?You may also take acetaminophen (Tylenol) as needed for fever. ? ?Work note extended through Tuesday. May return on Wednesday.  ? ?*Of Note: If your employer requires any other paperwork, such as Leave of Absence or  FMLA, you will be required to be seen in person as we are unable to complete paperwork through the virtual department. ? ?HOME CARE: ?Only take medications as instructed by your medical team. ?Drink plenty of fluids and get plenty of rest. ?A steam or ultrasonic humidifier can help if you have congestion.  ? ?GET HELP RIGHT AWAY IF YOU HAVE EMERGENCY WARNING SIGNS.  ?Call 911 or proceed to your closest emergency facility if: ?You develop worsening high fever. ?Trouble breathing ?Bluish lips or face ?Persistent pain or pressure in the chest ?New confusion ?Inability to wake or stay awake ?You cough up blood. ?Your symptoms become more severe ?Inability to hold down food or fluids ? ?This list is not all possible symptoms. Contact your medical provider for any symptoms that are severe or concerning to you. ? ? ? ?Your e-visit answers were reviewed by a board certified advanced clinical practitioner to complete your personal care plan.  Depending on the condition, your plan could have included both over the counter or prescription medications.  If there is a problem please reply once you have received a response from your provider. ? ?Your safety is important to [place].  If you have drug allergies check your prescription carefully.   ? ?You can use MyChart to ask questions about today's visit, request a non-urgent call back, or ask for a work or school excuse for 24 hours related to this e-Visit. If it has been greater  than 24 hours you will need to follow up with your provider, or enter a new e-Visit to address those concerns. ?You will get an e-mail in the next two days asking about your experience.  I hope that your e-visit has been valuable and will speed your recovery. Thank you for using e-visits. ? ? ? ?I provided 5 minutes of non face-to-face time during this encounter for chart review and documentation.  ? ?

## 2021-05-05 ENCOUNTER — Encounter: Payer: Self-pay | Admitting: Family Medicine

## 2021-05-05 ENCOUNTER — Ambulatory Visit: Payer: BC Managed Care – PPO | Admitting: Family Medicine

## 2021-05-05 VITALS — BP 118/82 | HR 92 | Ht 71.0 in | Wt 233.4 lb

## 2021-05-05 DIAGNOSIS — Z114 Encounter for screening for human immunodeficiency virus [HIV]: Secondary | ICD-10-CM

## 2021-05-05 DIAGNOSIS — Z1159 Encounter for screening for other viral diseases: Secondary | ICD-10-CM

## 2021-05-05 DIAGNOSIS — R7301 Impaired fasting glucose: Secondary | ICD-10-CM | POA: Diagnosis not present

## 2021-05-05 DIAGNOSIS — Z23 Encounter for immunization: Secondary | ICD-10-CM

## 2021-05-05 DIAGNOSIS — F1020 Alcohol dependence, uncomplicated: Secondary | ICD-10-CM | POA: Diagnosis not present

## 2021-05-05 DIAGNOSIS — Z0000 Encounter for general adult medical examination without abnormal findings: Secondary | ICD-10-CM

## 2021-05-05 DIAGNOSIS — E559 Vitamin D deficiency, unspecified: Secondary | ICD-10-CM

## 2021-05-05 NOTE — Assessment & Plan Note (Signed)
Concerns about his liver due to excessively drinking in the past.  ?Pending labs ?

## 2021-05-05 NOTE — Progress Notes (Addendum)
? ?New Patient Office Visit ? ?Subjective:  ?Patient ID: Charles Macias, male    DOB: [birth year]  Age: 36 y.o. MRN: [id number] ? ?CC:  ?Chief Complaint  ?Patient presents with  ? New Patient (Initial Visit)  ?  Pt would like to establish care, has concerns of family health hx.   ? ? ?HPI ?Charles Macias is a 36 y.o. male who presents for establishing care. He reports that he used to be a heavy drinker and is attending an AA class. He notes that he has been clean for 16 days. He shares that he used to take ten shots with two bottles of beer daily. He had a DUI about four years ago. At today's visit, the patient has no complaints or concerns. ? ?History reviewed. No pertinent past medical history. ? ?History reviewed. No pertinent surgical history. ? ?Family History  ?Problem Relation Age of Onset  ? COPD Mother   ? Lupus Mother   ? Heart attack Father   ? Heart attack Paternal Grandfather   ? ? ?Social History  ? ?Socioeconomic History  ? Marital status: Single  ?  Spouse name: Not on file  ? Number of children: Not on file  ? Years of education: Not on file  ? Highest education level: Not on file  ?Occupational History  ? Not on file  ?Tobacco Use  ? Smoking status: Every Day  ?  Packs/day: 0.25  ?  Types: Cigarettes  ? Smokeless tobacco: Never  ?Vaping Use  ? Vaping Use: Never used  ?Substance and Sexual Activity  ? Alcohol use: Yes  ?  Comment: beer and liquor on weekends  ? Drug use: No  ? Sexual activity: Not on file  ?Other Topics Concern  ? Not on file  ?Social History Narrative  ? Not on file  ? ?Social Determinants of Health  ? ?Financial Resource Strain: Not on file  ?Food Insecurity: Not on file  ?Transportation Needs: Not on file  ?Physical Activity: Not on file  ?Stress: Not on file  ?Social Connections: Not on file  ?Intimate Partner Violence: Not on file  ? ? ?ROS ?Review of Systems  ?Constitutional:  Negative for chills, fatigue and fever.  ?HENT:  Negative for rhinorrhea, sinus pressure,  sinus pain, sneezing and sore throat.   ?Eyes:  Negative for pain, redness and visual disturbance.  ?Respiratory:  Negative for cough, choking, chest tightness, shortness of breath and wheezing.   ?Cardiovascular:  Negative for chest pain and palpitations.  ?Gastrointestinal:  Negative for constipation, diarrhea, nausea and vomiting.  ?Endocrine: Negative for polydipsia, polyphagia and polyuria.  ?Genitourinary:  Negative for dysuria, frequency and urgency.  ?Musculoskeletal:  Negative for arthralgias, back pain and neck pain.  ?Skin:  Negative for rash and wound.  ?Neurological:  Negative for dizziness, tremors, weakness and numbness.  ?Psychiatric/Behavioral:  Negative for self-injury and suicidal ideas.   ? ?Objective:  ? ?Today's Vitals: BP 118/82   Pulse 92   Ht 5' 11" (1.803 m)   Wt 233 lb 6.4 oz (105.9 kg)   SpO2 94%   BMI 32.55 kg/m?  ? ?Physical Exam ?Constitutional:   ?   Appearance: Normal appearance.  ?HENT:  ?   Head: Normocephalic.  ?   Right Ear: External ear normal.  ?   Left Ear: External ear normal.  ?   Nose: No congestion or rhinorrhea.  ?   Mouth/Throat:  ?   Mouth: Mucous membranes are moist.  ?Eyes:  ?  General:     ?   Right eye: No discharge.     ?   Left eye: No discharge.  ?   Pupils: Pupils are equal, round, and reactive to light.  ?Cardiovascular:  ?   Rate and Rhythm: Normal rate and regular rhythm.  ?   Pulses: Normal pulses.  ?   Heart sounds: Normal heart sounds.  ?Pulmonary:  ?   Effort: Pulmonary effort is normal.  ?   Breath sounds: Normal breath sounds.  ?Abdominal:  ?   Palpations: Abdomen is soft.  ?Musculoskeletal:  ?   Right lower leg: No edema.  ?   Left lower leg: No edema.  ?Skin: ?   General: Skin is warm.  ?   Findings: No lesion or rash.  ?Neurological:  ?   Mental Status: He is alert and oriented to person, place, and time.  ?Psychiatric:  ?   Comments: Normal affect  ? ? ?Assessment & Plan:  ? ?Problem List Items Addressed This Visit   ? ?  ? Other  ? Alcoholism  ([place])  ?  Concerns about his liver due to excessively drinking in the past.  ?Pending labs ? ?  ?  ? ?Other Visit Diagnoses   ? ? Encounter for screening for HIV    -  Primary  ? Relevant Orders  ? HIV antibody (with reflex)  ? Need for hepatitis C screening test      ? Relevant Orders  ? Hepatitis C Antibody  ? IFG (impaired fasting glucose)      ? Relevant Orders  ? Hemoglobin A1c  ? Vitamin D deficiency      ? Relevant Orders  ? Vitamin D (25 hydroxy)  ? Encounter for Medicare annual wellness exam      ? Relevant Orders  ? CBC with Differential/Platelet  ? CMP14+EGFR  ? TSH + free T4  ? Lipid Profile  ? Need for diphtheria-tetanus-pertussis (Tdap) vaccine      ? Relevant Orders  ? Tdap vaccine greater than or equal to 7yo IM (Completed)  ? ?  ? ? ?Outpatient Encounter Medications as of 05/05/2021  ?Medication Sig  ? albuterol (VENTOLIN HFA) 108 (90 Base) MCG/ACT inhaler Inhale 2 puffs into the lungs every 6 (six) hours as needed for wheezing or shortness of breath.  ? amoxicillin (AMOXIL) 875 MG tablet Take 1 tablet (875 mg total) by mouth 2 (two) times daily.  ? benzonatate (TESSALON) 100 MG capsule Take 1 capsule (100 mg total) by mouth 3 (three) times daily as needed.  ? dexamethasone (DECADRON) 4 MG tablet Take 1 tablet (4 mg total) by mouth 2 (two) times daily.  ? fexofenadine-pseudoephedrine (ALLEGRA-D) 60-120 MG 12 hr tablet Take 1 tablet by mouth 2 (two) times daily as needed.  ? fluticasone (FLONASE) 50 MCG/ACT nasal spray Place 2 sprays into both nostrils daily.  ? lidocaine (XYLOCAINE) 2 % solution Use as directed 10 mLs in the mouth or throat every 3 (three) hours as needed for mouth pain.  ? varenicline (CHANTIX CONTINUING MONTH PAK) 1 MG tablet Take 1 tablet (1 mg total) by mouth 2 (two) times daily.  ? varenicline (CHANTIX STARTING MONTH PAK) 0.5 MG X 11 & 1 MG X 42 tablet Take one 0.5 mg tablet by mouth once daily for 3 days, then increase to one 0.5 mg tablet twice daily for 4 days, then increase  to one 1 mg tablet twice daily.  ? ?No facility-administered encounter medications on  file as of 05/05/2021.  ? ? ?Follow-up: No follow-ups on file.  ? ?Alvira Monday, FNP ?

## 2021-05-05 NOTE — Patient Instructions (Signed)
I appreciate the opportunity to provide care to you today! ?  ?Follow up: 6 months ? ?Labs: please stop by the lab during the week days to have your blood drawn (cbc,cmp,tsh. HgA1c, vit.d, lipid profile) ? ?Screening: Hep C and HIV ? ? ?  ?It was a pleasure to see you and I look forward to continuing to work together on your health and well-being. ?Please do not hesitate to call the office if you need care or have questions about your care. ?  ?Have a wonderful day and week. ?With Gratitude, ?Gilmore Laroche MSN, FNP-BC ? ?

## 2021-05-09 LAB — CMP14+EGFR
ALT: 27 IU/L (ref 0–44)
AST: 35 IU/L (ref 0–40)
Albumin/Globulin Ratio: 1.7 (ref 1.2–2.2)
Albumin: 4.5 g/dL (ref 4.0–5.0)
Alkaline Phosphatase: 73 IU/L (ref 44–121)
BUN/Creatinine Ratio: 15 (ref 9–20)
BUN: 13 mg/dL (ref 6–20)
Bilirubin Total: 0.3 mg/dL (ref 0.0–1.2)
CO2: 21 mmol/L (ref 20–29)
Calcium: 9.7 mg/dL (ref 8.7–10.2)
Chloride: 105 mmol/L (ref 96–106)
Creatinine, Ser: 0.86 mg/dL (ref 0.76–1.27)
Globulin, Total: 2.7 g/dL (ref 1.5–4.5)
Glucose: 92 mg/dL (ref 70–99)
Potassium: 4.8 mmol/L (ref 3.5–5.2)
Sodium: 140 mmol/L (ref 134–144)
Total Protein: 7.2 g/dL (ref 6.0–8.5)
eGFR: 115 mL/min/{1.73_m2} (ref >59)

## 2021-05-09 LAB — TSH+FREE T4
Free T4: 1.32 ng/dL (ref 0.82–1.77)
TSH: 0.593 u[IU]/mL (ref 0.450–4.500)

## 2021-05-09 LAB — LIPID PANEL
Chol/HDL Ratio: 4.7 ratio (ref 0.0–5.0)
Cholesterol, Total: 164 mg/dL (ref 100–199)
HDL: 35 mg/dL — ABNORMAL LOW (ref >39)
LDL Chol Calc (NIH): 117 mg/dL — ABNORMAL HIGH (ref 0–99)
Triglycerides: 58 mg/dL (ref 0–149)
VLDL Cholesterol Cal: 12 mg/dL (ref 5–40)

## 2021-05-09 LAB — CBC WITH DIFFERENTIAL/PLATELET
Basophils Absolute: 0.1 10*3/uL (ref 0.0–0.2)
Basos: 1 %
EOS (ABSOLUTE): 0.3 10*3/uL (ref 0.0–0.4)
Eos: 5 %
Hematocrit: 45.4 % (ref 37.5–51.0)
Hemoglobin: 15.4 g/dL (ref 13.0–17.7)
Immature Grans (Abs): 0 10*3/uL (ref 0.0–0.1)
Immature Granulocytes: 0 %
Lymphocytes Absolute: 1.9 10*3/uL (ref 0.7–3.1)
Lymphs: 33 %
MCH: 32 pg (ref 26.6–33.0)
MCHC: 33.9 g/dL (ref 31.5–35.7)
MCV: 94 fL (ref 79–97)
Monocytes Absolute: 0.5 10*3/uL (ref 0.1–0.9)
Monocytes: 8 %
Neutrophils Absolute: 3.1 10*3/uL (ref 1.4–7.0)
Neutrophils: 53 %
Platelets: 365 10*3/uL (ref 150–450)
RBC: 4.82 x10E6/uL (ref 4.14–5.80)
RDW: 12.4 % (ref 11.6–15.4)
WBC: 5.9 10*3/uL (ref 3.4–10.8)

## 2021-05-09 LAB — HIV ANTIBODY (ROUTINE TESTING W REFLEX): HIV Screen 4th Generation wRfx: NONREACTIVE

## 2021-05-09 LAB — HEMOGLOBIN A1C
Est. average glucose Bld gHb Est-mCnc: 114 mg/dL
Hgb A1c MFr Bld: 5.6 % (ref 4.8–5.6)

## 2021-05-09 LAB — HEPATITIS C ANTIBODY: Hep C Virus Ab: NONREACTIVE

## 2021-05-09 LAB — VITAMIN D 25 HYDROXY (VIT D DEFICIENCY, FRACTURES): Vit D, 25-Hydroxy: 19.8 ng/mL — ABNORMAL LOW (ref 30.0–100.0)

## 2021-05-10 ENCOUNTER — Other Ambulatory Visit: Payer: Self-pay | Admitting: Family Medicine

## 2021-05-10 DIAGNOSIS — E559 Vitamin D deficiency, unspecified: Secondary | ICD-10-CM

## 2021-05-10 MED ORDER — VITAMIN D (ERGOCALCIFEROL) 1.25 MG (50000 UNIT) PO CAPS: 50000.0000 [IU] | ORAL_CAPSULE | ORAL | 1 refills | Status: DC

## 2021-05-10 NOTE — Progress Notes (Signed)
Please inform the patient that his liver levels are within normal limits.  ? ?Advised the patient to take vit D supplements once weekly because her vit D is low. The order is placed and sent to her pharmacy.  ? ?His cholesterol was slightly elevated, and I  will reassess him at his next after he implements lifestyle changes.  ? ?. ? cholesterol-lowering diet low in fat or saturated fat because her cholesterol were slightly elevated. I would advise her to implement the Mediterranean diet, which emphasizes fruits, vegetables, whole grains, beans, nuts, seeds, and healthy fats. ? ? ? ? ?

## 2021-05-10 NOTE — Progress Notes (Signed)
Left a vm asking for a return call.  

## 2021-06-19 ENCOUNTER — Telehealth: Payer: BLUE CROSS/BLUE SHIELD | Admitting: Physician Assistant

## 2021-06-19 DIAGNOSIS — J020 Streptococcal pharyngitis: Secondary | ICD-10-CM

## 2021-06-19 MED ORDER — PENICILLIN V POTASSIUM 500 MG PO TABS: 500.0000 mg | ORAL_TABLET | Freq: Two times a day (BID) | ORAL | 0 refills | Status: AC

## 2021-06-19 NOTE — Progress Notes (Signed)
E-Visit for Sore Throat - Strep Symptoms  We are sorry that you are not feeling well.  Here is how we plan to help!  Based on what you have shared with me it is likely that you have strep pharyngitis.  Strep pharyngitis is inflammation and infection in the back of the throat.  This is an infection cause by bacteria and is treated with antibiotics.  I have prescribed Penicillin V 500 mg twice a day for 10 days. For throat pain, we recommend over the counter oral pain relief medications such as acetaminophen or aspirin, or anti-inflammatory medications such as ibuprofen or naproxen sodium. Topical treatments such as oral throat lozenges or sprays may be used as needed. Strep infections are not as easily transmitted as other respiratory infections, however we still recommend that you avoid close contact with loved ones, especially the very young and elderly.  Remember to wash your hands thoroughly throughout the day as this is the number one way to prevent the spread of infection and wipe down door knobs and counters with disinfectant.   Home Care: Only take medications as instructed by your medical team. Complete the entire course of an antibiotic. Do not take these medications with alcohol. A steam or ultrasonic humidifier can help congestion.  You can place a towel over your head and breathe in the steam from hot water coming from a faucet. Avoid close contacts especially the very young and the elderly. Cover your mouth when you cough or sneeze. Always remember to wash your hands.  Get Help Right Away If: You develop worsening fever or sinus pain. You develop a severe head ache or visual changes. Your symptoms persist after you have completed your treatment plan.  Make sure you Understand these instructions. Will watch your condition. Will get help right away if you are not doing well or get worse.   Thank you for choosing an e-visit.  Your e-visit answers were reviewed by a board  certified advanced clinical practitioner to complete your personal care plan. Depending upon the condition, your plan could have included both over the counter or prescription medications.  Please review your pharmacy choice. Make sure the pharmacy is open so you can pick up prescription now. If there is a problem, you may contact your provider through MyChart messaging and have the prescription routed to another pharmacy.  Your safety is important to us. If you have drug allergies check your prescription carefully.   For the next 24 hours you can use MyChart to ask questions about today's visit, request a non-urgent call back, or ask for a work or school excuse. You will get an email in the next two days asking about your experience. I hope that your e-visit has been valuable and will speed your recovery.  I provided 5 minutes of non face-to-face time during this encounter for chart review and documentation.   

## 2021-06-20 ENCOUNTER — Encounter: Payer: Self-pay | Admitting: Physician Assistant

## 2021-09-15 ENCOUNTER — Telehealth: Payer: BLUE CROSS/BLUE SHIELD | Admitting: Physician Assistant

## 2021-09-15 DIAGNOSIS — U071 COVID-19: Secondary | ICD-10-CM

## 2021-09-15 MED ORDER — FLUTICASONE PROPIONATE 50 MCG/ACT NA SUSP: 2.0000 | Freq: Every day | NASAL | 0 refills | Status: DC

## 2021-09-15 MED ORDER — PSEUDOEPH-BROMPHEN-DM 30-2-10 MG/5ML PO SYRP: 5.0000 mL | ORAL_SOLUTION | Freq: Four times a day (QID) | ORAL | 0 refills | Status: DC | PRN

## 2021-09-15 MED ORDER — ALBUTEROL SULFATE HFA 108 (90 BASE) MCG/ACT IN AERS: 2.0000 | INHALATION_SPRAY | Freq: Four times a day (QID) | RESPIRATORY_TRACT | 0 refills | Status: DC | PRN

## 2021-09-15 MED ORDER — BENZONATATE 100 MG PO CAPS: 100.0000 mg | ORAL_CAPSULE | Freq: Three times a day (TID) | ORAL | 0 refills | Status: DC | PRN

## 2021-09-15 NOTE — Progress Notes (Signed)
E-Visit  for Positive Covid Test Result  We are sorry you are not feeling well. We are here to help!  You have tested positive for COVID-19, meaning that you were infected with the novel coronavirus and could give the virus to others.  It is vitally important that you stay home so you do not spread it to others.      Please continue isolation at home, for at least 10 days since the start of your symptoms and until you have had 24 hours with no fever (without taking a fever reducer) and with improving of symptoms.  If you have no symptoms but tested positive (or all symptoms resolve after 5 days and you have no fever) you can leave your house but continue to wear a mask around others for an additional 5 days. If you have a fever,continue to stay home until you have had 24 hours of no fever. Most cases improve 5-10 days from onset but we have seen a small number of patients who have gotten worse after the 10 days.  Please be sure to watch for worsening symptoms and remain taking the proper precautions.   Go to the nearest hospital ED for assessment if fever/cough/breathlessness are severe or illness seems like a threat to life.    The following symptoms may appear 2-14 days after exposure: Fever Cough Shortness of breath or difficulty breathing Chills Repeated shaking with chills Muscle pain Headache Sore throat New loss of taste or smell Fatigue Congestion or runny nose Nausea or vomiting Diarrhea  You have been enrolled in MyChart Home Monitoring for COVID-19. Daily you will receive a questionnaire within the MyChart website. Our COVID-19 response team will be monitoring your responses daily.  You can use medication such as prescription cough medication called Tessalon Perles 100 mg. You may take 1-2 capsules every 8 hours as needed for cough. Bromfed DM cough syrup You can take 49mL every 6 hours as needed for cough. This can be used with the Tessalon perles or alternate. A prescription  inhaler called Albuterol MDI 90 mcg /actuation 2 puffs every 4 hours as needed for shortness of breath, wheezing, cough, and prescription for Fluticasone nasal spray 2 sprays in each nostril one time per day  You may also take acetaminophen (Tylenol) as needed for fever.  HOME CARE: Only take medications as instructed by your medical team. Drink plenty of fluids and get plenty of rest. A steam or ultrasonic humidifier can help if you have congestion.   GET HELP RIGHT AWAY IF YOU HAVE EMERGENCY WARNING SIGNS.  Call 911 or proceed to your closest emergency facility if: You develop worsening high fever. Trouble breathing Bluish lips or face Persistent pain or pressure in the chest New confusion Inability to wake or stay awake You cough up blood. Your symptoms become more severe Inability to hold down food or fluids  This list is not all possible symptoms. Contact your medical provider for any symptoms that are severe or concerning to you.    Your e-visit answers were reviewed by a board certified advanced clinical practitioner to complete your personal care plan.  Depending on the condition, your plan could have included both over the counter or prescription medications.  If there is a problem please reply once you have received a response from your provider.  Your safety is important to [place].  If you have drug allergies check your prescription carefully.    You can use MyChart to ask questions about today's visit,  request a non-urgent call back, or ask for a work or school excuse for 24 hours related to this e-Visit. If it has been greater than 24 hours you will need to follow up with your provider, or enter a new e-Visit to address those concerns. You will get an e-mail in the next two days asking about your experience.  I hope that your e-visit has been valuable and will speed your recovery. Thank you for using e-visits.  I provided 5 minutes of non face-to-face time during this  encounter for chart review and documentation.

## 2021-10-02 ENCOUNTER — Encounter: Payer: Self-pay | Admitting: Family Medicine

## 2021-10-02 ENCOUNTER — Ambulatory Visit (INDEPENDENT_AMBULATORY_CARE_PROVIDER_SITE_OTHER): Payer: No Typology Code available for payment source | Admitting: Family Medicine

## 2021-10-02 ENCOUNTER — Ambulatory Visit: Payer: BLUE CROSS/BLUE SHIELD | Admitting: Internal Medicine

## 2021-10-02 VITALS — BP 137/87 | HR 83 | Ht 71.0 in | Wt 230.0 lb

## 2021-10-02 DIAGNOSIS — Z113 Encounter for screening for infections with a predominantly sexual mode of transmission: Secondary | ICD-10-CM

## 2021-10-02 DIAGNOSIS — K648 Other hemorrhoids: Secondary | ICD-10-CM

## 2021-10-02 DIAGNOSIS — R300 Dysuria: Secondary | ICD-10-CM

## 2021-10-02 DIAGNOSIS — K649 Unspecified hemorrhoids: Secondary | ICD-10-CM | POA: Insufficient documentation

## 2021-10-02 DIAGNOSIS — Z2821 Immunization not carried out because of patient refusal: Secondary | ICD-10-CM

## 2021-10-02 LAB — POCT URINALYSIS DIP (CLINITEK)
Bilirubin, UA: NEGATIVE
Glucose, UA: NEGATIVE mg/dL
Ketones, POC UA: NEGATIVE mg/dL
Leukocytes, UA: NEGATIVE
Nitrite, UA: NEGATIVE
POC PROTEIN,UA: NEGATIVE
Spec Grav, UA: >=1.03 — AB (ref 1.010–1.025)
Urobilinogen, UA: 0.2 E.U./dL
pH, UA: 5.5 (ref 5.0–8.0)

## 2021-10-02 MED ORDER — HYDROCORTISONE ACETATE 25 MG RE SUPP: 25.0000 mg | Freq: Two times a day (BID) | RECTAL | 0 refills | Status: DC

## 2021-10-02 NOTE — Patient Instructions (Addendum)
I appreciate the opportunity to provide care to you today!    Follow up:  11/03/21  Please pick up your medication at the pharmacy    Please continue to a heart-healthy diet and increase your physical activities. Try to exercise for 7mins at least three times a week.      It was a pleasure to see you and I look forward to continuing to work together on your health and well-being. Please do not hesitate to call the office if you need care or have questions about your care.   Have a wonderful day and week. With Gratitude, Alvira Monday MSN, FNP-BC

## 2021-10-02 NOTE — Assessment & Plan Note (Addendum)
denies urgency and frequency UA negative for leukocytes and nitrates C/o a feeling of pressure in his urethra when trying to urinate He notes his urinary odor and very concentrated urine despite his increased intake of fluids He denies abnormal discharge and burning He is sexually active Pending STD results

## 2021-10-02 NOTE — Assessment & Plan Note (Signed)
He c/o  pain and discomfort when defecating, noting to be on the toilet for 45 minutes yesterday He reports a decreased intake of fiber since he began vegan He denies night sweats, fever, weight loss, abdominal pain, chronic diarrhea, and changes in stool Hydrocortisone suppository ordered Informed to call if symptoms persist after therapy  Will refer to GI if hematochezia persist

## 2021-10-02 NOTE — Progress Notes (Signed)
a  Acute Office Visit  Subjective:     Patient ID: Charles Macias, male    DOB: 1985-08-30, 36 y.o.   MRN: [id number]  Chief Complaint  Patient presents with   Rectal Pain    Having problems with urine when using the bath room and rectal pain started around 09/11/21. Seeing blood when wipeing .tried using cream for rectum but didn't help.    HPI The patient is in today with c/o internal hemorrhoids. He c/o  pain and discomfort when defecating, noting to be on the toilet for 45 minutes yesterday. He reports a decreased intake of fiber since he began vegan. He denies night sweats, fever, weight loss, abdominal pain, chronic diarrhea, and changes in stool.  Urinary concerns: denies urgency frequency,  fever and chills.C/o a feeling of pressure in his urethra when trying to urinate. He notes his urinary odor and very concentrated urine despite his increased intake of fluids. He denies abnormal discharge and burning. He is sexually active.   Review of Systems  Constitutional:  Negative for chills and fever.  Cardiovascular:  Negative for chest pain and palpitations.  Gastrointestinal:  Positive for blood in stool. Negative for abdominal pain, diarrhea, melena, nausea and vomiting.  Genitourinary:  Negative for dysuria, flank pain, frequency, hematuria and urgency.        Objective:    BP 137/87 (BP Location: Right Arm, Patient Position: Sitting)   Pulse 83   Ht 5\' 11"  (1.803 m)   Wt 230 lb (104.3 kg)   SpO2 96%   BMI 32.08 kg/m    Physical Exam HENT:     Head: Normocephalic.     Right Ear: External ear normal.     Left Ear: External ear normal.  Cardiovascular:     Rate and Rhythm: Normal rate and regular rhythm.     Pulses: Normal pulses.     Heart sounds: Normal heart sounds.  Pulmonary:     Effort: Pulmonary effort is normal.     Breath sounds: Normal breath sounds.  Neurological:     Mental Status: He is alert.     Results for orders placed or performed in  visit on 10/02/21  POCT URINALYSIS DIP (CLINITEK)  Result Value Ref Range   Color, UA yellow yellow   Clarity, UA clear clear   Glucose, UA negative negative mg/dL   Bilirubin, UA negative negative   Ketones, POC UA negative negative mg/dL   Spec Grav, UA >=1.030 (A) 1.010 - 1.025   Blood, UA trace-intact (A) negative   pH, UA 5.5 5.0 - 8.0   POC PROTEIN,UA negative negative, trace   Urobilinogen, UA 0.2 0.2 or 1.0 E.U./dL   Nitrite, UA Negative Negative   Leukocytes, UA Negative Negative        Assessment & Plan:   Problem List Items Addressed This Visit       Cardiovascular and Mediastinum   Hemorrhoids    He c/o  pain and discomfort when defecating, noting to be on the toilet for 45 minutes yesterday He reports a decreased intake of fiber since he began vegan He denies night sweats, fever, weight loss, abdominal pain, chronic diarrhea, and changes in stool Hydrocortisone suppository ordered Informed to call if symptoms persist after therapy  Will refer to GI if hematochezia persist        Other   Screening for STD (sexually transmitted disease)    denies urgency and frequency UA negative for leukocytes and nitrates C/o a feeling  of pressure in his urethra when trying to urinate He notes his urinary odor and very concentrated urine despite his increased intake of fluids He denies abnormal discharge and burning He is sexually active Pending STD results        Other Visit Diagnoses     Internal hemorrhoid    -  Primary   Relevant Medications   hydrocortisone (ANUSOL-HC) 25 MG suppository   Refused influenza vaccine       Dysuria       Relevant Orders   POCT URINALYSIS DIP (CLINITEK) (Completed)       Meds ordered this encounter  Medications   DISCONTD: hydrocortisone (ANUSOL-HC) 25 MG suppository    Sig: Place 1 suppository (25 mg total) rectally 2 (two) times daily.    Dispense:  12 suppository    Refill:  0   hydrocortisone (ANUSOL-HC) 25 MG  suppository    Sig: Place 1 suppository (25 mg total) rectally 2 (two) times daily.    Dispense:  12 suppository    Refill:  0    Return if symptoms worsen or fail to improve.  Gilmore Laroche, FNP

## 2021-11-03 ENCOUNTER — Encounter: Payer: Self-pay | Admitting: Family Medicine

## 2021-11-03 ENCOUNTER — Ambulatory Visit (INDEPENDENT_AMBULATORY_CARE_PROVIDER_SITE_OTHER): Payer: No Typology Code available for payment source | Admitting: Family Medicine

## 2021-11-03 VITALS — BP 122/82 | HR 87 | Ht 71.0 in | Wt 233.1 lb

## 2021-11-03 DIAGNOSIS — E7849 Other hyperlipidemia: Secondary | ICD-10-CM

## 2021-11-03 DIAGNOSIS — E038 Other specified hypothyroidism: Secondary | ICD-10-CM

## 2021-11-03 DIAGNOSIS — R7301 Impaired fasting glucose: Secondary | ICD-10-CM | POA: Diagnosis not present

## 2021-11-03 DIAGNOSIS — Z113 Encounter for screening for infections with a predominantly sexual mode of transmission: Secondary | ICD-10-CM

## 2021-11-03 DIAGNOSIS — F1020 Alcohol dependence, uncomplicated: Secondary | ICD-10-CM

## 2021-11-03 DIAGNOSIS — E559 Vitamin D deficiency, unspecified: Secondary | ICD-10-CM | POA: Diagnosis not present

## 2021-11-03 DIAGNOSIS — Z720 Tobacco use: Secondary | ICD-10-CM

## 2021-11-03 DIAGNOSIS — Z716 Tobacco abuse counseling: Secondary | ICD-10-CM

## 2021-11-03 MED ORDER — VARENICLINE TARTRATE 0.5 MG PO TABS: ORAL_TABLET | ORAL | 0 refills | Status: DC

## 2021-11-03 NOTE — Assessment & Plan Note (Signed)
No symptoms reported by patient today No recent coital act Will screen patient for STD per his request

## 2021-11-03 NOTE — Progress Notes (Deleted)
     Virtual Visit via Telephone Note   This visit type was conducted via telephone. This format is felt to be most appropriate for this patient at this time.  The patient did not have access to video technology/had technical difficulties with video requiring transitioning to audio format only (telephone).  All issues noted in this document were discussed and addressed.  No physical exam could be performed with this format.  Evaluation Performed:  Follow-up visit  Date:  11/03/2021   ID:  Charles Macias, DOB [birth year], MRN [id number]  Patient Location: Home Provider Location: Office/Clinic  Participants: Patient*** Location of Patient: Home Location of Provider: Telehealth Consent was obtain for visit to be over via telehealth. I verified that I am speaking with the correct person using two identifiers.  PCP:  Alvira Monday, FNP   Chief Complaint:  ***  History of Present Illness:    Charles Macias is a 36 y.o. male with ***  The patient {does/does not:200015} have symptoms concerning for COVID-19 infection (fever, chills, cough, or new shortness of breath).   Past Medical, Surgical, Social History, Allergies, and Medications have been Reviewed.  History reviewed. No pertinent past medical history. History reviewed. No pertinent surgical history.   No outpatient medications have been marked as taking for the 11/03/21 encounter (Office Visit) with Alvira Monday, [place].     Allergies:   Patient has no known allergies.   ROS:   Please see the history of present illness.    *** All other systems reviewed and are negative.   Labs/Other Tests and Data Reviewed:    Recent Labs: 05/08/2021: ALT 27; BUN 13; Creatinine, Ser 0.86; Hemoglobin 15.4; Platelets 365; Potassium 4.8; Sodium 140; TSH 0.593   Recent Lipid Panel Lab Results  Component Value Date/Time   CHOL 164 05/08/2021 08:04 AM   TRIG 58 05/08/2021 08:04 AM   HDL 35 (L) 05/08/2021 08:04 AM    CHOLHDL 4.7 05/08/2021 08:04 AM   LDLCALC 117 (H) 05/08/2021 08:04 AM    Wt Readings from Last 3 Encounters:  11/03/21 233 lb 1.3 oz (105.7 kg)  10/02/21 230 lb (104.3 kg)  05/05/21 233 lb 6.4 oz (105.9 kg)     Objective:    Vital Signs:  BP 122/82   Pulse 87   Ht 5\' 11"  (1.803 m)   Wt 233 lb 1.3 oz (105.7 kg)   SpO2 97%   BMI 32.51 kg/m    {[facility] Virtual Exam (Optional):[phone]::"VITAL SIGNS:  reviewed"}  ASSESSMENT & PLAN:     Time:   Today, I have spent *** minutes reviewing the chart, including problem list, medications, and with the patient with telehealth technology discussing the above problems.   Medication Adjustments/Labs and Tests Ordered: Current medicines are reviewed at length with the patient today.  Concerns regarding medicines are outlined above.   Tests Ordered: No orders of the defined types were placed in this encounter.   Medication Changes: No orders of the defined types were placed in this encounter.    Note: This dictation was prepared with Dragon dictation along with smaller phrase technology. Similar sounding words can be transcribed inadequately or may not be corrected upon review. Any transcriptional errors that result from this process are unintentional.      Disposition:  Follow up  Signed, Alvira Monday, FNP  11/03/2021 4:28 PM     [facility]

## 2021-11-03 NOTE — Assessment & Plan Note (Signed)
He reports alcohol cessation 7 months ago Congratulated patient on alcohol cessation

## 2021-11-03 NOTE — Progress Notes (Signed)
Established Patient Office Visit  Subjective:  Patient ID: Charles Macias, male    DOB: Nov 01, 1985  Age: 36 y.o. MRN: [id number]  CC:  Chief Complaint  Patient presents with   Follow-up    6 month f/u. Would like to discuss weight, and smoking cessation, pt is fasting in case blood work is needed.     HPI Charles Macias is a 36 y.o. male with past medical history of alcoholism, tobacco use, and alcoholism presents for f/u of  chronic medical conditions.  Tobacco use: He smokes 1 pack of cigarettes daily, noting that he would like to quit smoking.  Alcoholism: He reports alcohol cessation 7 months ago  STD screening: He would like to be tested today for STDs; he denies symptoms of urgency, frequency, discharge, and burning with urination.  He denies recent coital act.  History reviewed. No pertinent past medical history.  History reviewed. No pertinent surgical history.  Family History  Problem Relation Age of Onset   COPD Mother    Lupus Mother    Heart attack Father    Heart attack Paternal Grandfather     Social History   Socioeconomic History   Marital status: Single    Spouse name: Not on file   Number of children: Not on file   Years of education: Not on file   Highest education level: Not on file  Occupational History   Not on file  Tobacco Use   Smoking status: Every Day    Packs/day: 0.25    Types: Cigarettes   Smokeless tobacco: Never  Vaping Use   Vaping Use: Never used  Substance and Sexual Activity   Alcohol use: Yes    Comment: beer and liquor on weekends   Drug use: No   Sexual activity: Not on file  Other Topics Concern   Not on file  Social History Narrative   Not on file   Social Determinants of Health   Financial Resource Strain: Not on file  Food Insecurity: Not on file  Transportation Needs: Not on file  Physical Activity: Not on file  Stress: Not on file  Social Connections: Not on file  Intimate Partner  Violence: Not on file    Outpatient Medications Prior to Visit  Medication Sig Dispense Refill   albuterol (VENTOLIN HFA) 108 (90 Base) MCG/ACT inhaler Inhale 2 puffs into the lungs every 6 (six) hours as needed for wheezing or shortness of breath. (Patient not taking: Reported on 10/02/2021) 8 g 0   benzonatate (TESSALON) 100 MG capsule Take 1 capsule (100 mg total) by mouth 3 (three) times daily as needed. (Patient not taking: Reported on 10/02/2021) 30 capsule 0   brompheniramine-pseudoephedrine-DM 30-2-10 MG/5ML syrup Take 5 mLs by mouth 4 (four) times daily as needed. (Patient not taking: Reported on 10/02/2021) 120 mL 0   fluticasone (FLONASE) 50 MCG/ACT nasal spray Place 2 sprays into both nostrils daily. (Patient not taking: Reported on 10/02/2021) 16 g 0   hydrocortisone (ANUSOL-HC) 25 MG suppository Place 1 suppository (25 mg total) rectally 2 (two) times daily. 12 suppository 0   Vitamin D, Ergocalciferol, (DRISDOL) 1.25 MG (50000 UNIT) CAPS capsule Take 1 capsule (50,000 Units total) by mouth every 7 (seven) days. (Patient not taking: Reported on 10/02/2021) 5 capsule 1   No facility-administered medications prior to visit.    No Known Allergies  ROS Review of Systems  Constitutional:  Negative for fatigue and fever.  Eyes:  Negative for visual disturbance.  Cardiovascular:  Negative for chest pain and palpitations.  Gastrointestinal:  Negative for constipation and nausea.  Genitourinary:  Negative for frequency, genital sores, hematuria and urgency.      Objective:    Physical Exam HENT:     Head: Normocephalic.  Cardiovascular:     Rate and Rhythm: Normal rate and regular rhythm.     Pulses: Normal pulses.     Heart sounds: Normal heart sounds.  Pulmonary:     Effort: Pulmonary effort is normal.     Breath sounds: Normal breath sounds.  Neurological:     Mental Status: He is alert.     BP 122/82   Pulse 87   Ht 5' 11" (1.803 m)   Wt 233 lb 1.3 oz (105.7 kg)    SpO2 97%   BMI 32.51 kg/m  Wt Readings from Last 3 Encounters:  11/03/21 233 lb 1.3 oz (105.7 kg)  10/02/21 230 lb (104.3 kg)  05/05/21 233 lb 6.4 oz (105.9 kg)    Lab Results  Component Value Date   TSH 0.593 05/08/2021   Lab Results  Component Value Date   WBC 5.9 05/08/2021   HGB 15.4 05/08/2021   HCT 45.4 05/08/2021   MCV 94 05/08/2021   PLT 365 05/08/2021   Lab Results  Component Value Date   NA 140 05/08/2021   K 4.8 05/08/2021   CO2 21 05/08/2021   GLUCOSE 92 05/08/2021   BUN 13 05/08/2021   CREATININE 0.86 05/08/2021   BILITOT 0.3 05/08/2021   ALKPHOS 73 05/08/2021   AST 35 05/08/2021   ALT 27 05/08/2021   PROT 7.2 05/08/2021   ALBUMIN 4.5 05/08/2021   CALCIUM 9.7 05/08/2021   EGFR 115 05/08/2021   Lab Results  Component Value Date   CHOL 164 05/08/2021   Lab Results  Component Value Date   HDL 35 (L) 05/08/2021   Lab Results  Component Value Date   LDLCALC 117 (H) 05/08/2021   Lab Results  Component Value Date   TRIG 58 05/08/2021   Lab Results  Component Value Date   CHOLHDL 4.7 05/08/2021   Lab Results  Component Value Date   HGBA1C 5.6 05/08/2021      Assessment & Plan:   Problem List Items Addressed This Visit       Other   Alcoholism ([place])    He reports alcohol cessation 7 months ago Congratulated patient on alcohol cessation      Screening for STD (sexually transmitted disease)    No symptoms reported by patient today No recent coital act Will screen patient for STD per his request      Tobacco use - Primary    He reports that he has been smoking since the age of 45 He smokes 1 pack of cigarettes daily He is interested in quitting today We will start patient on Chantix 0.5 mg daily for  3 days, 0.5 mg twice daily for 3 days, and 1 mg twice daily for treatment duration       Other Visit Diagnoses     Vitamin D deficiency       Relevant Orders   Vitamin D (25 hydroxy)   Other hyperlipidemia       Relevant  Orders   Lipid Profile   IFG (impaired fasting glucose)       Relevant Orders   CBC with Differential/Platelet   CMP14+EGFR   Hemoglobin A1C   Other specified hypothyroidism       Relevant  Orders   TSH + free T4   Encounter for smoking cessation counseling       Relevant Medications   varenicline (CHANTIX) 0.5 MG tablet   Screening examination for STD (sexually transmitted disease)       Relevant Orders   Chlamydia/Gonococcus/Trichomonas, NAA       Meds ordered this encounter  Medications   varenicline (CHANTIX) 0.5 MG tablet    Sig: Take 1 tablet (0.5 mg total) by mouth daily for 3 days, THEN 1 tablet (0.5 mg total) 2 (two) times daily for 3 days, THEN 2 tablets (1 mg total) 2 (two) times daily.    Dispense:  321 tablet    Refill:  0    Follow-up: Return in about 3 months (around 02/03/2022).    Alvira Monday, FNP

## 2021-11-03 NOTE — Patient Instructions (Signed)
I appreciate the opportunity to provide care to you today!    Follow up:  3 months  Labs: please stop by the lab today to get your blood drawn (CBC, CMP, TSH, Lipid profile, HgA1c, Vit D)   Please pick up your medication at the pharmacy   Please continue to a heart-healthy diet and increase your physical activities. Try to exercise for 57mins at least three times a week.      It was a pleasure to see you and I look forward to continuing to work together on your health and well-being. Please do not hesitate to call the office if you need care or have questions about your care.   Have a wonderful day and week. With Gratitude, Alvira Monday MSN, FNP-BC

## 2021-11-03 NOTE — Assessment & Plan Note (Addendum)
He reports that he has been smoking since the age of 35 He smokes 1 pack of cigarettes daily He is interested in quitting today We will start patient on Chantix 0.5 mg daily for  3 days, 0.5 mg twice daily for 3 days, and 1 mg twice daily for treatment duration

## 2021-11-04 LAB — CMP14+EGFR
ALT: 20 IU/L (ref 0–44)
AST: 31 IU/L (ref 0–40)
Albumin/Globulin Ratio: 1.8 (ref 1.2–2.2)
Albumin: 4.2 g/dL (ref 4.1–5.1)
Alkaline Phosphatase: 70 IU/L (ref 44–121)
BUN/Creatinine Ratio: 15 (ref 9–20)
BUN: 11 mg/dL (ref 6–20)
Bilirubin Total: 0.6 mg/dL (ref 0.0–1.2)
CO2: 24 mmol/L (ref 20–29)
Calcium: 9.3 mg/dL (ref 8.7–10.2)
Chloride: 103 mmol/L (ref 96–106)
Creatinine, Ser: 0.75 mg/dL — ABNORMAL LOW (ref 0.76–1.27)
Globulin, Total: 2.3 g/dL (ref 1.5–4.5)
Glucose: 85 mg/dL (ref 70–99)
Potassium: 4.3 mmol/L (ref 3.5–5.2)
Sodium: 140 mmol/L (ref 134–144)
Total Protein: 6.5 g/dL (ref 6.0–8.5)
eGFR: 120 mL/min/{1.73_m2} (ref >59)

## 2021-11-04 LAB — CBC WITH DIFFERENTIAL/PLATELET
Basophils Absolute: 0.1 10*3/uL (ref 0.0–0.2)
Basos: 1 %
EOS (ABSOLUTE): 0.8 10*3/uL — ABNORMAL HIGH (ref 0.0–0.4)
Eos: 9 %
Hematocrit: 45.2 % (ref 37.5–51.0)
Hemoglobin: 15.5 g/dL (ref 13.0–17.7)
Immature Grans (Abs): 0 10*3/uL (ref 0.0–0.1)
Immature Granulocytes: 0 %
Lymphocytes Absolute: 3.7 10*3/uL — ABNORMAL HIGH (ref 0.7–3.1)
Lymphs: 40 %
MCH: 31.5 pg (ref 26.6–33.0)
MCHC: 34.3 g/dL (ref 31.5–35.7)
MCV: 92 fL (ref 79–97)
Monocytes Absolute: 0.6 10*3/uL (ref 0.1–0.9)
Monocytes: 7 %
Neutrophils Absolute: 4 10*3/uL (ref 1.4–7.0)
Neutrophils: 43 %
Platelets: 339 10*3/uL (ref 150–450)
RBC: 4.92 x10E6/uL (ref 4.14–5.80)
RDW: 13.3 % (ref 11.6–15.4)
WBC: 9.4 10*3/uL (ref 3.4–10.8)

## 2021-11-04 LAB — VITAMIN D 25 HYDROXY (VIT D DEFICIENCY, FRACTURES): Vit D, 25-Hydroxy: 14.2 ng/mL — ABNORMAL LOW (ref 30.0–100.0)

## 2021-11-04 LAB — TSH+FREE T4
Free T4: 1.27 ng/dL (ref 0.82–1.77)
TSH: 0.491 u[IU]/mL (ref 0.450–4.500)

## 2021-11-04 LAB — LIPID PANEL
Chol/HDL Ratio: 5.3 ratio — ABNORMAL HIGH (ref 0.0–5.0)
Cholesterol, Total: 191 mg/dL (ref 100–199)
HDL: 36 mg/dL — ABNORMAL LOW (ref >39)
LDL Chol Calc (NIH): 139 mg/dL — ABNORMAL HIGH (ref 0–99)
Triglycerides: 87 mg/dL (ref 0–149)
VLDL Cholesterol Cal: 16 mg/dL (ref 5–40)

## 2021-11-04 LAB — HEMOGLOBIN A1C
Est. average glucose Bld gHb Est-mCnc: 123 mg/dL
Hgb A1c MFr Bld: 5.9 % — ABNORMAL HIGH (ref 4.8–5.6)

## 2021-11-05 ENCOUNTER — Encounter: Payer: Self-pay | Admitting: Family Medicine

## 2021-11-06 LAB — CHLAMYDIA/GONOCOCCUS/TRICHOMONAS, NAA
Chlamydia by NAA: NEGATIVE
Gonococcus by NAA: NEGATIVE
Trich vag by NAA: NEGATIVE

## 2021-11-13 ENCOUNTER — Other Ambulatory Visit: Payer: Self-pay | Admitting: Family Medicine

## 2021-11-13 DIAGNOSIS — E559 Vitamin D deficiency, unspecified: Secondary | ICD-10-CM

## 2021-11-13 MED ORDER — VITAMIN D (ERGOCALCIFEROL) 1.25 MG (50000 UNIT) PO CAPS: 50000.0000 [IU] | ORAL_CAPSULE | ORAL | 2 refills | Status: DC

## 2021-11-13 NOTE — Progress Notes (Signed)
Please inform the patient that a prescription for vitamin D once weekly supplement is sent to his pharmacy to start taking.  His vitamin D is low.  His cholesterol is elevated, I recommend low carbs and fat diet with increased physical activities.  Please encourage the patient to increase his intake of fresh fruits and vegetables.

## 2021-11-17 ENCOUNTER — Telehealth: Payer: No Typology Code available for payment source | Admitting: Emergency Medicine

## 2021-11-17 DIAGNOSIS — J020 Streptococcal pharyngitis: Secondary | ICD-10-CM

## 2021-11-17 MED ORDER — PENICILLIN V POTASSIUM 500 MG PO TABS: 500.0000 mg | ORAL_TABLET | Freq: Two times a day (BID) | ORAL | 0 refills | Status: AC

## 2021-11-17 NOTE — Progress Notes (Signed)

## 2021-11-17 NOTE — Progress Notes (Signed)
Duplicate entry

## 2022-01-09 ENCOUNTER — Encounter: Payer: Self-pay | Admitting: Family Medicine

## 2022-01-09 ENCOUNTER — Other Ambulatory Visit: Payer: Self-pay

## 2022-01-09 ENCOUNTER — Telehealth (INDEPENDENT_AMBULATORY_CARE_PROVIDER_SITE_OTHER): Payer: No Typology Code available for payment source | Admitting: Family Medicine

## 2022-01-09 DIAGNOSIS — J209 Acute bronchitis, unspecified: Secondary | ICD-10-CM

## 2022-01-09 DIAGNOSIS — J0120 Acute ethmoidal sinusitis, unspecified: Secondary | ICD-10-CM | POA: Diagnosis not present

## 2022-01-09 DIAGNOSIS — J989 Respiratory disorder, unspecified: Secondary | ICD-10-CM

## 2022-01-09 DIAGNOSIS — R509 Fever, unspecified: Secondary | ICD-10-CM

## 2022-01-09 DIAGNOSIS — Z720 Tobacco use: Secondary | ICD-10-CM

## 2022-01-09 DIAGNOSIS — F17210 Nicotine dependence, cigarettes, uncomplicated: Secondary | ICD-10-CM | POA: Diagnosis not present

## 2022-01-09 LAB — POCT INFLUENZA A/B
Influenza A, POC: NEGATIVE
Influenza B, POC: NEGATIVE

## 2022-01-09 MED ORDER — AZITHROMYCIN 250 MG PO TABS: ORAL_TABLET | ORAL | 0 refills | Status: AC

## 2022-01-09 MED ORDER — BENZONATATE 100 MG PO CAPS: 100.0000 mg | ORAL_CAPSULE | Freq: Two times a day (BID) | ORAL | 0 refills | Status: AC | PRN

## 2022-01-09 NOTE — Assessment & Plan Note (Signed)
Asked:confirms currently smokes cigarettes, unable in past several days, due to illness Assess: Unwilling to set a quit date, but is cutting back Advise: needs to QUIT to reduce risk of cancer, cardio and cerebrovascular disease Assist: counseled for 5 minutes  Arrange: follow up in 2 to 4 months, has appt scheduled with PCP

## 2022-01-09 NOTE — Progress Notes (Signed)
Virtual Visit via Video Note  I connected with Charles Macias on 01/09/22 at  9:20 AM EST by a video enabled telemedicine application and verified that I am speaking with the correct person using two identifiers.  Location: Patient: home Provider: office   I discussed the limitations of evaluation and management by telemedicine and the availability of in person appointments. The patient expressed understanding and agreed to proceed.  History of Present Illness:   Acute head ancd chest congestion, body aches , chills, fever to 101.5 , started 12/31, has been in public places, football game with no mask on 12/ 29 at [facility], and another sport event the following day Fatigue, no energy, children had flu 2 weeks ago Pressure over ethmoid sinuses, sputum and drainage are brown Positive nicotine use Negative covid test on 01/06/2022 Observations/Objective: Good communication with no confusion and intact memory. Ill appearing, cough deep and rattling and head congestion noted on exam Assessment and Plan:  Acute bronchitis Check flu and covid status. Z pack, tessalon perles and work excuse to return 01/12/2021 unless covid positive Rest , adequate fluid intake and med as prescribed advised   Acute ethmoidal sinusitis Z pack prescribed  Tobacco use Asked:confirms currently smokes cigarettes, unable in past several days, due to illness Assess: Unwilling to set a quit date, but is cutting back Advise: needs to QUIT to reduce risk of cancer, cardio and cerebrovascular disease Assist: counseled for 5 minutes  Arrange: follow up in 2 to 4 months, has appt scheduled with PCP   Follow Up Instructions:    I discussed the assessment and treatment plan with the patient. The patient was provided an opportunity to ask questions and all were answered. The patient agreed with the plan and demonstrated an understanding of the instructions.   The patient was advised to call back or seek an  in-person evaluation if the symptoms worsen or if the condition fails to improve as anticipated.  I provided 17 minutes of non-face-to-face time during this encounter.   Tula Nakayama, MD

## 2022-01-09 NOTE — Assessment & Plan Note (Signed)
Z pack prescribed 

## 2022-01-09 NOTE — Assessment & Plan Note (Signed)
Check flu and covid status. Z pack, tessalon perles and work excuse to return 01/12/2021 unless covid positive Rest , adequate fluid intake and med as prescribed advised

## 2022-01-09 NOTE — Assessment & Plan Note (Signed)
Swab for influenza is negative , covid test sent out, rest and isolation recommended in the interim, ED if decompensates

## 2022-01-09 NOTE — Patient Instructions (Addendum)
F/u With PCP as before, call if you need to be seen sooner  Come to office for flu swab and specimen to be sent to lab for covid swab also  Work excuse  from today to return 01/05/ 2024  Azithromycin and tessalon perles are prescribed for you to treat  acute sinusitis and bronchitis, use tylenol 325 mg , one to two tabs every 6 hours if needed, for fever and chills and body aches  Please try to commit, now that you are unable to smoke to stop smoking and work at this intentionally  If your symptoms worsen , as in fever that will not break, shortness of breath, fatigue, you need to go to the   Urgent Care or the eD for evaluation and management  Thanks for choosing [facility], we consider it a privelige to serve you.

## 2022-01-11 ENCOUNTER — Ambulatory Visit (HOSPITAL_COMMUNITY)
Admission: RE | Admit: 2022-01-11 | Discharge: 2022-01-11 | Disposition: A | Discharge status: Home / Self Care | Payer: No Typology Code available for payment source | Source: Ambulatory Visit | Attending: Family Medicine | Admitting: Family Medicine

## 2022-01-11 ENCOUNTER — Telehealth: Payer: Self-pay | Admitting: Family Medicine

## 2022-01-11 DIAGNOSIS — R051 Acute cough: Secondary | ICD-10-CM

## 2022-01-11 LAB — NOVEL CORONAVIRUS, NAA: SARS-CoV-2, NAA: NOT DETECTED

## 2022-01-11 NOTE — Telephone Encounter (Signed)
Patient called in regard to work note , wants to see if can get extended. Still not feeling well.  Patient wants a call back in regard.   Last  visit  1/2 virtual w. Moshe Cipro

## 2022-01-12 ENCOUNTER — Other Ambulatory Visit: Payer: Self-pay | Admitting: Physician Assistant

## 2022-01-12 ENCOUNTER — Other Ambulatory Visit: Payer: Self-pay | Admitting: Family Medicine

## 2022-01-12 ENCOUNTER — Other Ambulatory Visit: Payer: Self-pay

## 2022-01-12 DIAGNOSIS — U071 COVID-19: Secondary | ICD-10-CM

## 2022-01-12 DIAGNOSIS — Z716 Tobacco abuse counseling: Secondary | ICD-10-CM

## 2022-01-12 MED ORDER — VARENICLINE TARTRATE 0.5 MG PO TABS: ORAL_TABLET | ORAL | 0 refills | Status: AC

## 2022-01-12 MED ORDER — ALBUTEROL SULFATE HFA 108 (90 BASE) MCG/ACT IN AERS: 2.0000 | INHALATION_SPRAY | Freq: Four times a day (QID) | RESPIRATORY_TRACT | 0 refills | Status: DC | PRN

## 2022-01-12 MED ORDER — ALBUTEROL SULFATE HFA 108 (90 BASE) MCG/ACT IN AERS: 2.0000 | INHALATION_SPRAY | Freq: Four times a day (QID) | RESPIRATORY_TRACT | 0 refills | Status: AC | PRN

## 2022-01-12 NOTE — Telephone Encounter (Signed)
Rx sent 

## 2022-01-12 NOTE — Telephone Encounter (Signed)
Spoke to pt, he will come by to pick up work note.

## 2022-02-05 ENCOUNTER — Ambulatory Visit: Payer: No Typology Code available for payment source | Admitting: Family Medicine

## 2022-02-19 ENCOUNTER — Ambulatory Visit (INDEPENDENT_AMBULATORY_CARE_PROVIDER_SITE_OTHER): Payer: No Typology Code available for payment source | Admitting: Family Medicine

## 2022-02-19 ENCOUNTER — Encounter: Payer: Self-pay | Admitting: Family Medicine

## 2022-02-19 VITALS — BP 138/85 | HR 85 | Ht 71.0 in | Wt 249.0 lb

## 2022-02-19 DIAGNOSIS — E559 Vitamin D deficiency, unspecified: Secondary | ICD-10-CM

## 2022-02-19 DIAGNOSIS — Z720 Tobacco use: Secondary | ICD-10-CM | POA: Diagnosis not present

## 2022-02-19 DIAGNOSIS — E669 Obesity, unspecified: Secondary | ICD-10-CM

## 2022-02-19 DIAGNOSIS — E038 Other specified hypothyroidism: Secondary | ICD-10-CM

## 2022-02-19 DIAGNOSIS — R7301 Impaired fasting glucose: Secondary | ICD-10-CM | POA: Diagnosis not present

## 2022-02-19 DIAGNOSIS — E66811 Obesity, class 1: Secondary | ICD-10-CM

## 2022-02-19 DIAGNOSIS — E7849 Other hyperlipidemia: Secondary | ICD-10-CM

## 2022-02-19 NOTE — Assessment & Plan Note (Signed)
Reports taking over-the-counter vitamin D supplement Will assess vitamin D levels today

## 2022-02-19 NOTE — Patient Instructions (Signed)
I appreciate the opportunity to provide care to you today!    Follow up:  4 months  Labs: please stop by the lab during the week to get your blood drawn (CBC, CMP, TSH, Lipid profile, HgA1c, Vit D)  Smoking is harmful to your health and increases your risk for cancer, COPD, high blood pressure, cataracts, digestive problems, or health problems , such as gum disease, mouth sores, and tooth loss and loss of taste and smell. Smoking irritates your throat and causes coughing.    Please continue to a heart-healthy diet and increase your physical activities. Try to exercise for 84mns at least five times a week.   Physical activity helps: Lower your blood glucose, improve your heart health, lower your blood pressure and cholesterol, burn calories to help manage her weight, gave you energy, lower stress, and improve his sleep.  The American diabetes Association (ADA) recommends being active for 2-1/2 hours (150 minutes) or more week.  Exercise for 30 minutes, 5 days a week (150 minutes total)    It was a pleasure to see you and I look forward to continuing to work together on your health and well-being. Please do not hesitate to call the office if you need care or have questions about your care.   Have a wonderful day and week. With Gratitude, GAlvira MondayMSN, FNP-BC

## 2022-02-19 NOTE — Progress Notes (Signed)
Established Patient Office Visit  Subjective:  Patient ID: Charles Macias, male    DOB: 11/29/1985  Age: 37 y.o. MRN: [id number]  CC:  Chief Complaint  Patient presents with   Follow-up    3 month f/u.     HPI Charles Macias is a 37 y.o. male with past medical history of vitamin D deficiency, obesity, tobacco use presents for f/u of  chronic medical conditions. For the details of today's visit, please refer to the assessment and plan.           History reviewed. No pertinent past medical history.  History reviewed. No pertinent surgical history.  Family History  Problem Relation Age of Onset   COPD Mother    Lupus Mother    Heart attack Father    Heart attack Paternal Grandfather     Social History   Socioeconomic History   Marital status: Single    Spouse name: Not on file   Number of children: Not on file   Years of education: Not on file   Highest education level: Not on file  Occupational History   Not on file  Tobacco Use   Smoking status: Every Day    Packs/day: 0.25    Types: Cigarettes   Smokeless tobacco: Never  Vaping Use   Vaping Use: Never used  Substance and Sexual Activity   Alcohol use: Yes    Comment: beer and liquor on weekends   Drug use: No   Sexual activity: Not on file  Other Topics Concern   Not on file  Social History Narrative   Not on file   Social Determinants of Health   Financial Resource Strain: Not on file  Food Insecurity: Not on file  Transportation Needs: Not on file  Physical Activity: Not on file  Stress: Not on file  Social Connections: Not on file  Intimate Partner Violence: Not on file    Outpatient Medications Prior to Visit  Medication Sig Dispense Refill   albuterol (VENTOLIN HFA) 108 (90 Base) MCG/ACT inhaler Inhale 2 puffs into the lungs every 6 (six) hours as needed for wheezing or shortness of breath. 8 g 0   varenicline (CHANTIX) 0.5 MG tablet Take 1 tablet (0.5 mg total) by mouth  daily for 3 days, THEN 1 tablet (0.5 mg total) 2 (two) times daily for 3 days, THEN 2 tablets (1 mg total) 2 (two) times daily. 321 tablet 0   Vitamin D, Ergocalciferol, (DRISDOL) 1.25 MG (50000 UNIT) CAPS capsule Take 1 capsule (50,000 Units total) by mouth every 7 (seven) days. 5 capsule 2   No facility-administered medications prior to visit.    No Known Allergies  ROS Review of Systems  Constitutional:  Negative for fatigue and fever.  Eyes:  Negative for visual disturbance.  Respiratory:  Negative for chest tightness and shortness of breath.   Cardiovascular:  Negative for chest pain and palpitations.  Neurological:  Negative for dizziness and headaches.      Objective:    Physical Exam HENT:     Head: Normocephalic.     Right Ear: External ear normal.     Left Ear: External ear normal.     Nose: No congestion or rhinorrhea.     Mouth/Throat:     Mouth: Mucous membranes are moist.  Cardiovascular:     Rate and Rhythm: Regular rhythm.     Heart sounds: No murmur heard. Pulmonary:     Effort: No respiratory distress.  Breath sounds: Normal breath sounds.  Neurological:     Mental Status: He is alert.     BP 138/85   Pulse 85   Ht 5' 11"$  (1.803 m)   Wt 249 lb 0.6 oz (113 kg)   SpO2 94%   BMI 34.73 kg/m  Wt Readings from Last 3 Encounters:  02/19/22 249 lb 0.6 oz (113 kg)  11/03/21 233 lb 1.3 oz (105.7 kg)  10/02/21 230 lb (104.3 kg)    Lab Results  Component Value Date   TSH 0.491 11/03/2021   Lab Results  Component Value Date   WBC 9.4 11/03/2021   HGB 15.5 11/03/2021   HCT 45.2 11/03/2021   MCV 92 11/03/2021   PLT 339 11/03/2021   Lab Results  Component Value Date   NA 140 11/03/2021   K 4.3 11/03/2021   CO2 24 11/03/2021   GLUCOSE 85 11/03/2021   BUN 11 11/03/2021   CREATININE 0.75 (L) 11/03/2021   BILITOT 0.6 11/03/2021   ALKPHOS 70 11/03/2021   AST 31 11/03/2021   ALT 20 11/03/2021   PROT 6.5 11/03/2021   ALBUMIN 4.2 11/03/2021    CALCIUM 9.3 11/03/2021   EGFR 120 11/03/2021   Lab Results  Component Value Date   CHOL 191 11/03/2021   Lab Results  Component Value Date   HDL 36 (L) 11/03/2021   Lab Results  Component Value Date   LDLCALC 139 (H) 11/03/2021   Lab Results  Component Value Date   TRIG 87 11/03/2021   Lab Results  Component Value Date   CHOLHDL 5.3 (H) 11/03/2021   Lab Results  Component Value Date   HGBA1C 5.9 (H) 11/03/2021      Assessment & Plan:  Tobacco use Assessment & Plan: Reports not taking Chantix due to side effects of the medication States that 1 pack of cigarettes can last 3 days Reports listening to audio on smoking cessation Encouraged smoking cessation   Obesity (BMI 30.0-34.9) Assessment & Plan: Admits to minimal physical activity and unhealthy eating habits Reviewed my weight management plan with the patient Reports that he is going to try to be more physically active and make changes to his diet Encouraged to engage in 150 min of moderate intensity physical activities weekly Encouraged heart healthy diet Wt Readings from Last 3 Encounters:  02/19/22 249 lb 0.6 oz (113 kg)  11/03/21 233 lb 1.3 oz (105.7 kg)  10/02/21 230 lb (104.3 kg)       Vitamin D deficiency Assessment & Plan: Reports taking over-the-counter vitamin D supplement Will assess vitamin D levels today  Orders: -     VITAMIN D 25 Hydroxy (Vit-D Deficiency, Fractures)  IFG (impaired fasting glucose) -     Hemoglobin A1c  Other specified hypothyroidism -     TSH + free T4  Other hyperlipidemia -     Lipid panel -     CMP14+EGFR -     CBC with Differential/Platelet    Follow-up: Return in about 4 months (around 06/20/2022).   Alvira Monday, FNP

## 2022-02-19 NOTE — Assessment & Plan Note (Signed)
Admits to minimal physical activity and unhealthy eating habits Reviewed my weight management plan with the patient Reports that he is going to try to be more physically active and make changes to his diet Encouraged to engage in 150 min of moderate intensity physical activities weekly Encouraged heart healthy diet Wt Readings from Last 3 Encounters:  02/19/22 249 lb 0.6 oz (113 kg)  11/03/21 233 lb 1.3 oz (105.7 kg)  10/02/21 230 lb (104.3 kg)

## 2022-02-19 NOTE — Assessment & Plan Note (Signed)
Reports not taking Chantix due to side effects of the medication States that 1 pack of cigarettes can last 3 days Reports listening to audio on smoking cessation Encouraged smoking cessation

## 2022-06-18 ENCOUNTER — Encounter: Payer: Self-pay | Admitting: Internal Medicine

## 2022-06-18 ENCOUNTER — Ambulatory Visit (INDEPENDENT_AMBULATORY_CARE_PROVIDER_SITE_OTHER): Payer: No Typology Code available for payment source | Admitting: Internal Medicine

## 2022-06-18 VITALS — BP 129/88 | HR 90 | Resp 15 | Ht 71.0 in | Wt 245.0 lb

## 2022-06-18 DIAGNOSIS — M25562 Pain in left knee: Secondary | ICD-10-CM

## 2022-06-18 NOTE — Patient Instructions (Addendum)
You may have a meniscal tear.  I would continue avoiding activities which worsen your knee pain. When you get swelling ice the knee for 15 minutes and elevate your leg.  My recommendation is to follow-up with your Workmen's Comp doctor and discuss starting exercises.  I usually start patients with right leg raises without weight and then increase weight as tolerated. Also could start seeing physical therapy Follow up with me if you need treatment of pain or further evaluation.

## 2022-06-18 NOTE — Progress Notes (Signed)
   HPI:[name] is a 37 y.o. male who presents for evaluation of left knee pain. He injured his left knee on the job and has been seeing the recommended workers comp. This started with dropping a heavy piece of metal on knee. Since then he has pain on medial joint line of left knee and lateral side of knee above knee cap. He has been recommended to avoid all activities , exercise, and on modified work schedule. He is irritated because they are not listening to what he is telling them. He has on and off swelling. Sometimes the pain is not bad. He would like to start regular activity to test his knee , but is frustrated he is being told not to.   Physical Exam: Vitals:   06/18/22 1536  BP: 129/88  Pulse: 90  Resp: 15  SpO2: 95%  Weight: 245 lb (111.1 kg)  Height: 5\' 11"  (1.803 m)     Physical Exam Constitutional:      Appearance: He is not ill-appearing.     Comments: Appears frustrated during conversation  Musculoskeletal:     Comments: Knee: - Inspection: No gross deformity. No effusion. No erythema or bruising. Skin intact - Palpation: TTP on medial joint line  - ROM: full active ROM with flexion and extension in knee - Strength: 5/5 strength - Neuro/vasc: NV intact - Special Tests: - LIGAMENTS: negative anterior/posterior drawer, negative Lachman's, no MCL or LCL laxity  -- MENISCUS: Pain with Mcmurrays, with lateral and medial rotation of tibia. No clicking or popping -- PF JOINT: nml patellar mobility without apprehension. Negative patellar grind   Hips: normal, pain free passive ROM with IR/ER        Assessment & Plan:   [name] was seen today for knee pain.  Acute pain of left knee Assessment & Plan: Patient may have a meniscal tear. POCUS did not demonstrate tear but can not fully evaluate meniscus. Also not significant effusion seen.  I would continue avoiding activities which worsen your knee pain. When you get swelling ice the knee for 15  minutes and elevate your leg.  My recommendation is to follow-up with your Workmen's Comp doctor and discuss starting exercises.  I usually start patients with right leg raises without weight and then increase weight as tolerated. Also could start seeing physical therapy Follow up with me if you need treatment of pain or further evaluation.        Milus Banister, MD

## 2022-06-20 ENCOUNTER — Ambulatory Visit
Admission: EM | Admit: 2022-06-20 | Discharge: 2022-06-20 | Disposition: A | Discharge status: Home / Self Care | Payer: No Typology Code available for payment source | Attending: Nurse Practitioner | Admitting: Nurse Practitioner

## 2022-06-20 DIAGNOSIS — M25562 Pain in left knee: Secondary | ICD-10-CM | POA: Insufficient documentation

## 2022-06-20 DIAGNOSIS — R197 Diarrhea, unspecified: Secondary | ICD-10-CM | POA: Diagnosis not present

## 2022-06-20 DIAGNOSIS — R112 Nausea with vomiting, unspecified: Secondary | ICD-10-CM | POA: Diagnosis not present

## 2022-06-20 MED ORDER — ONDANSETRON 4 MG PO TBDP
4.0000 mg | ORAL_TABLET | Freq: Once | ORAL | Status: AC
  Administered 2022-06-20: 4 mg via ORAL

## 2022-06-20 MED ORDER — ONDANSETRON 4 MG PO TBDP: 4.0000 mg | ORAL_TABLET | Freq: Three times a day (TID) | ORAL | 0 refills | Status: DC | PRN

## 2022-06-20 MED ORDER — ACETAMINOPHEN 500 MG PO TABS
1000.0000 mg | ORAL_TABLET | Freq: Once | ORAL | Status: AC
  Administered 2022-06-20: 1000 mg via ORAL

## 2022-06-20 NOTE — Discharge Instructions (Signed)
As we discussed, I suspect you have a viral stomach bug.  This should improve over the next couple of days.  Continue the Zofran at home every 8 hours as needed for nausea/vomiting.  Push hydration with water or Pedialyte.  You can take Tylenol or ibuprofen for the headache.  Seek care if you develop blood in your vomit or stool or severe vomiting and are unable to keep fluids down with the medicine.

## 2022-06-20 NOTE — Assessment & Plan Note (Addendum)
Patient may have a meniscal tear. POCUS did not demonstrate tear but can not fully evaluate meniscus. Also not significant effusion seen.  I would continue avoiding activities which worsen your knee pain. When you get swelling ice the knee for 15 minutes and elevate your leg.  My recommendation is to follow-up with your Workmen's Comp doctor and discuss starting exercises.  I usually start patients with right leg raises without weight and then increase weight as tolerated. Also could start seeing physical therapy Follow up with me if you need treatment of pain or further evaluation.

## 2022-06-20 NOTE — ED Provider Notes (Signed)
[facility]    CSN: [id number] Arrival date & time: 06/20/22  1142      History   Chief Complaint No chief complaint on file.   HPI Charles Macias is a 37 y.o. male.   Patient presents today for 1 day history of abdominal pain, diarrhea and vomiting, and headache.  He endorses 3-4 episodes of very watery stool today that is nonbloody.  Also endorses nausea and 2 episodes of vomiting today.  Reports the vomiting is bile like and does not contain any blood.  Denies current abdominal pain, fever, loss of taste or smell, recent cough, congestion, or sore throat.  Reports he does have a headache.  He has not tried to eat or drink anything today because he is nervous he will throw up.  Has not taken anything for the headache.  Denies recent foreign travel, known sick contacts, recent eating out, and known sick contacts.  Denies recent antibiotic use.  Has taken Pepto-Bismol for symptoms without improvement.    History reviewed. No pertinent past medical history.  Patient Active Problem List   Diagnosis Date Noted   Left knee pain 06/20/2022   Vitamin D deficiency 02/19/2022   Obesity (BMI 30.0-34.9) 02/19/2022   Acute ethmoidal sinusitis 01/09/2022   Acute bronchitis 01/09/2022   Respiratory illness with fever 01/09/2022   Tobacco use 11/03/2021   Hemorrhoids 10/02/2021   Screening for STD (sexually transmitted disease) 10/02/2021   Alcoholism (HCC) 05/05/2021    History reviewed. No pertinent surgical history.     Home Medications    Prior to Admission medications   Medication Sig Start Date End Date Taking? Authorizing Provider  ondansetron (ZOFRAN-ODT) 4 MG disintegrating tablet Take 1 tablet (4 mg total) by mouth every 8 (eight) hours as needed for nausea or vomiting. 06/20/22  Yes Valentino Nose, NP  albuterol (VENTOLIN HFA) 108 (90 Base) MCG/ACT inhaler Inhale 2 puffs into the lungs every 6 (six) hours as needed for wheezing or shortness of  breath. 01/12/22   Gilmore Laroche, FNP    Family History Family History  Problem Relation Age of Onset   COPD Mother    Lupus Mother    Heart attack Father    Heart attack Paternal Grandfather     Social History Social History   Tobacco Use   Smoking status: Every Day    Packs/day: .25    Types: Cigarettes   Smokeless tobacco: Never  Vaping Use   Vaping Use: Never used  Substance Use Topics   Alcohol use: Yes    Comment: beer and liquor on weekends   Drug use: No     Allergies   Patient has no known allergies.   Review of Systems Review of Systems Per HPI  Physical Exam Triage Vital Signs ED Triage Vitals [06/20/22 1211]  Enc Vitals Group     BP (!) 131/94     Pulse Rate 90     Resp 16     Temp 98.4 F (36.9 C)     Temp Source Oral     SpO2 94 %     Weight      Height      Head Circumference      Peak Flow      Pain Score 5     Pain Loc      Pain Edu?      Excl. in GC?    No data found.  Updated Vital Signs BP (!) 131/94 (BP Location:  Right Arm)   Pulse 90   Temp 98.4 F (36.9 C) (Oral)   Resp 16   SpO2 94%   Visual Acuity Right Eye Distance:   Left Eye Distance:   Bilateral Distance:    Right Eye Near:   Left Eye Near:    Bilateral Near:     Physical Exam Vitals and nursing note reviewed.  Constitutional:      General: He is not in acute distress.    Appearance: Normal appearance. He is not toxic-appearing.  HENT:     Head: Normocephalic and atraumatic.     Mouth/Throat:     Mouth: Mucous membranes are moist.     Pharynx: Oropharynx is clear. No posterior oropharyngeal erythema.  Cardiovascular:     Rate and Rhythm: Normal rate and regular rhythm.  Pulmonary:     Effort: Pulmonary effort is normal. No respiratory distress.     Breath sounds: Normal breath sounds. No wheezing, rhonchi or rales.  Abdominal:     General: Abdomen is flat. Bowel sounds are normal. There is no distension.     Palpations: Abdomen is soft.      Tenderness: There is no abdominal tenderness. There is no right CVA tenderness, left CVA tenderness, guarding or rebound.  Musculoskeletal:     Cervical back: Normal range of motion.  Lymphadenopathy:     Cervical: No cervical adenopathy.  Skin:    General: Skin is warm and dry.     Capillary Refill: Capillary refill takes less than 2 seconds.     Coloration: Skin is not jaundiced or pale.     Findings: No erythema.  Neurological:     Mental Status: He is alert.     Motor: No weakness.     Gait: Gait normal.  Psychiatric:        Behavior: Behavior is cooperative.      UC Treatments / Results  Labs (all labs ordered are listed, but only abnormal results are displayed) Labs Reviewed - No data to display  EKG   Radiology No results found.  Procedures Procedures (including critical care time)  Medications Ordered in UC Medications  ondansetron (ZOFRAN-ODT) disintegrating tablet 4 mg (4 mg Oral Given 06/20/22 1300)  acetaminophen (TYLENOL) tablet 1,000 mg (1,000 mg Oral Given 06/20/22 1301)    Initial Impression / Assessment and Plan / UC Course  I have reviewed the triage vital signs and the nursing notes.  Pertinent labs & imaging results that were available during my care of the patient were reviewed by me and considered in my medical decision making (see chart for details).   Patient is well-appearing, afebrile, not tachycardic, not tachypneic, oxygenating well on room air.  Patient is mildly hypertensive today in urgent care.  1. Nausea, vomiting, and diarrhea Suspect viral gastroenteritis Vitals and exam today are reassuring Zofran 4 mg ODT given with improvement in nausea Patient able to tolerate water after Zofran administered Start Zofran at home every 8 hours as needed for nausea/vomiting Push hydration with plenty of fluids and Pedialyte Continue Tylenol/ibuprofen as needed for headache Strict ER precautions discussed with patient  Note given for  work  The patient was given the opportunity to ask questions.  All questions answered to their satisfaction.  The patient is in agreement to this plan.    Final Clinical Impressions(s) / UC Diagnoses   Final diagnoses:  Nausea, vomiting, and diarrhea     Discharge Instructions      As we discussed, I suspect  you have a viral stomach bug.  This should improve over the next couple of days.  Continue the Zofran at home every 8 hours as needed for nausea/vomiting.  Push hydration with water or Pedialyte.  You can take Tylenol or ibuprofen for the headache.  Seek care if you develop blood in your vomit or stool or severe vomiting and are unable to keep fluids down with the medicine.     ED Prescriptions     Medication Sig Dispense Auth. Provider   ondansetron (ZOFRAN-ODT) 4 MG disintegrating tablet Take 1 tablet (4 mg total) by mouth every 8 (eight) hours as needed for nausea or vomiting. 20 tablet Valentino Nose, NP      PDMP not reviewed this encounter.   Valentino Nose, NP 06/20/22 1413

## 2022-06-20 NOTE — ED Triage Notes (Signed)
Pt reports he has been having diarrhea, n/v, and headache x 1 day. Took pepto but no relief.

## 2022-06-21 ENCOUNTER — Telehealth: Payer: No Typology Code available for payment source | Admitting: Family Medicine

## 2022-06-21 DIAGNOSIS — R197 Diarrhea, unspecified: Secondary | ICD-10-CM

## 2022-06-21 NOTE — Progress Notes (Signed)

## 2022-06-22 ENCOUNTER — Ambulatory Visit: Payer: No Typology Code available for payment source | Admitting: Family Medicine

## 2022-06-25 ENCOUNTER — Emergency Department (HOSPITAL_COMMUNITY): Payer: No Typology Code available for payment source

## 2022-06-25 ENCOUNTER — Emergency Department (HOSPITAL_COMMUNITY)
Admission: EM | Admit: 2022-06-25 | Discharge: 2022-06-25 | Disposition: A | Discharge status: Home / Self Care | Payer: No Typology Code available for payment source | Attending: Emergency Medicine | Admitting: Emergency Medicine

## 2022-06-25 ENCOUNTER — Other Ambulatory Visit: Payer: Self-pay

## 2022-06-25 DIAGNOSIS — R079 Chest pain, unspecified: Secondary | ICD-10-CM

## 2022-06-25 DIAGNOSIS — R0789 Other chest pain: Secondary | ICD-10-CM | POA: Diagnosis present

## 2022-06-25 LAB — TROPONIN I (HIGH SENSITIVITY)
Troponin I (High Sensitivity): 4 ng/L (ref <18)
Troponin I (High Sensitivity): 3 ng/L (ref <18)

## 2022-06-25 LAB — CBC
HCT: 49.5 % (ref 39.0–52.0)
Hemoglobin: 16.7 g/dL (ref 13.0–17.0)
MCH: 31.4 pg (ref 26.0–34.0)
MCHC: 33.7 g/dL (ref 30.0–36.0)
MCV: 93 fL (ref 80.0–100.0)
Platelets: 346 10*3/uL (ref 150–400)
RBC: 5.32 MIL/uL (ref 4.22–5.81)
RDW: 13.4 % (ref 11.5–15.5)
WBC: 9.3 10*3/uL (ref 4.0–10.5)
nRBC: 0 % (ref 0.0–0.2)

## 2022-06-25 LAB — BASIC METABOLIC PANEL
Anion gap: 8 (ref 5–15)
BUN: 13 mg/dL (ref 6–20)
CO2: 26 mmol/L (ref 22–32)
Calcium: 8.8 mg/dL — ABNORMAL LOW (ref 8.9–10.3)
Chloride: 103 mmol/L (ref 98–111)
Creatinine, Ser: 0.84 mg/dL (ref 0.61–1.24)
GFR, Estimated: >60 mL/min (ref >60)
Glucose, Bld: 114 mg/dL — ABNORMAL HIGH (ref 70–99)
Potassium: 3.6 mmol/L (ref 3.5–5.1)
Sodium: 137 mmol/L (ref 135–145)

## 2022-06-25 NOTE — ED Notes (Signed)
Patient transported to X-ray 

## 2022-06-25 NOTE — ED Provider Notes (Signed)
[place] EMERGENCY DEPARTMENT AT [facility] Provider Note   CSN: [id number] Arrival date & time: 06/25/22  4098     History  Chief Complaint  Patient presents with   Chest Pain    Charles Macias is a 37 y.o. male.   Chest Pain Patient presents for chest pain.  Chest pain has been lower left-sided.  It has been intermittent over the past 2 days.  He denies any other associated symptoms.  Patient has not noticed that it is any better or worse with exertion or postprandially.  This morning, patient woke up with worsened chest pain.  At that time, it is 5/10 in severity.  He is currently pain-free.  He does smoke.  Father had a heart attack in his early 10s.  Patient is not currently followed by cardiology.     Home Medications Prior to Admission medications   Medication Sig Start Date End Date Taking? Authorizing Provider  albuterol (VENTOLIN HFA) 108 (90 Base) MCG/ACT inhaler Inhale 2 puffs into the lungs every 6 (six) hours as needed for wheezing or shortness of breath. 01/12/22   Gilmore Laroche, FNP  ondansetron (ZOFRAN-ODT) 4 MG disintegrating tablet Take 1 tablet (4 mg total) by mouth every 8 (eight) hours as needed for nausea or vomiting. 06/20/22   Valentino Nose, NP      Allergies    Patient has no known allergies.    Review of Systems   Review of Systems  Cardiovascular:  Positive for chest pain.  All other systems reviewed and are negative.   Physical Exam Updated Vital Signs BP (!) 136/93   Pulse 73   Temp 97.6 F (36.4 C) (Oral)   Resp (!) 27   Ht 5\' 11"  (1.803 m)   Wt 111.2 kg   SpO2 98%   BMI 34.19 kg/m  Physical Exam Vitals and nursing note reviewed.  Constitutional:      General: He is not in acute distress.    Appearance: He is well-developed. He is not ill-appearing, toxic-appearing or diaphoretic.  HENT:     Head: Normocephalic and atraumatic.  Eyes:     Conjunctiva/sclera: Conjunctivae normal.  Cardiovascular:      Rate and Rhythm: Normal rate and regular rhythm.     Heart sounds: No murmur heard. Pulmonary:     Effort: Pulmonary effort is normal. No tachypnea or respiratory distress.     Breath sounds: Normal breath sounds. No decreased breath sounds, wheezing, rhonchi or rales.  Chest:     Chest wall: Tenderness present.  Abdominal:     Palpations: Abdomen is soft.     Tenderness: There is no abdominal tenderness.  Musculoskeletal:        General: No swelling. Normal range of motion.     Cervical back: Normal range of motion and neck supple.     Right lower leg: No edema.     Left lower leg: No edema.  Skin:    General: Skin is warm and dry.     Capillary Refill: Capillary refill takes less than 2 seconds.     Coloration: Skin is not cyanotic or pale.  Neurological:     General: No focal deficit present.     Mental Status: He is alert and oriented to person, place, and time.  Psychiatric:        Mood and Affect: Mood normal.        Behavior: Behavior normal.     ED Results / Procedures /  Treatments   Labs (all labs ordered are listed, but only abnormal results are displayed) Labs Reviewed  BASIC METABOLIC PANEL - Abnormal; Notable for the following components:      Result Value   Glucose, Bld 114 (*)    Calcium 8.8 (*)    All other components within normal limits  CBC  TROPONIN I (HIGH SENSITIVITY)  TROPONIN I (HIGH SENSITIVITY)    EKG EKG Interpretation  Date/Time:  Monday June 25 2022 06:31:17 EDT Ventricular Rate:  91 PR Interval:  197 QRS Duration: 89 QT Interval:  339 QTC Calculation: 417 R Axis:   49 Text Interpretation: Sinus rhythm Borderline prolonged PR interval Consider left ventricular hypertrophy Since last tracing Non-specific ST-t changes Confirmed by Susy Frizzle [phone]) on 06/25/2022 6:56:45 AM  Radiology DG Chest 2 View  Result Date: 06/25/2022 CLINICAL DATA:  37 year old male with chest pain for 2 days. EXAM: CHEST - 2 VIEW COMPARISON:  Chest  radiographs 01/11/2022 and earlier. FINDINGS: PA and lateral views at 0636 hours. Mildly lower lung volumes on both views. Mediastinal contours remain normal. Visualized tracheal air column is within normal limits. No pneumothorax, pulmonary edema, pleural effusion or confluent lung opacity. No osseous abnormality identified. Negative visible bowel gas. IMPRESSION: Lower lung volumes.  No acute cardiopulmonary abnormality. Electronically Signed   By: Odessa Fleming M.D.   On: 06/25/2022 06:46    Procedures Procedures    Medications Ordered in ED Medications - No data to display  ED Course/ Medical Decision Making/ A&P                             Medical Decision Making Amount and/or Complexity of Data Reviewed Labs: ordered. Radiology: ordered.   Patient presenting with intermittent left-sided chest pain over the past 2 days.  This was mildly worsened this morning but has since resolved.  Patient is well-appearing on exam.  Area of pain is mildly tender to touch.  Pain is mildly activated with pectoralis flexion.  I do suspect musculoskeletal etiology.  Workup today in the ED is reassuring with normal troponins x 2 and no concerning EKG findings.  Chest x-ray also shows no acute findings.  Patient remained asymptomatic while in the ED.  He is stable for discharge.  Patient does have risk factors of ACS that include current smoking and positive family history of early ACS.  He would benefit from cardiology follow-up.  Cardiology referral was ordered.  Patient was discharged in stable condition.         Final Clinical Impression(s) / ED Diagnoses Final diagnoses:  Left-sided chest pain    Rx / DC Orders ED Discharge Orders          Ordered    Ambulatory referral to Cardiology       Comments: If you have not heard from the Cardiology office within the next 72 hours please call [phone].   06/25/22 1046              Gloris Manchester, MD 06/25/22 1047

## 2022-06-25 NOTE — Discharge Instructions (Signed)
The testing done in the emergency department today is reassuring.  You would benefit from establishing care with cardiology.  If you do not hear from the cardiology office in the next 2 days, call the number below.  Return to the emergency department for any new or worsening symptoms of concern.

## 2022-06-25 NOTE — ED Triage Notes (Signed)
Pt c/o left sided chest pain x 1 day  

## 2022-06-27 ENCOUNTER — Telehealth: Payer: Self-pay

## 2022-06-27 NOTE — Transitions of Care (Post Inpatient/ED Visit) (Signed)
   06/27/2022  Name: Charles Macias MRN: [id number] DOB: Sep 21, 1985  Today's TOC FU Call Status:    Transition Care Management Follow-up Telephone Call Date of Discharge: 06/25/22 [facility] Type of Discharge: Emergency Department Reason for ED Visit: Respiratory How have you been since you were released from the hospital?: Better Any questions or concerns?: No  Items Reviewed: Did you receive and understand the discharge instructions provided?: Yes Medications obtained,verified, and reconciled?: Yes (Medications Reviewed) Any new allergies since your discharge?: No Dietary orders reviewed?: NA Do you have support at home?: No People in Home: alone  Medications Reviewed Today: Medications Reviewed Today     Reviewed by Freddy Finner, NP (Nurse Practitioner) on 06/21/22 at 1423  Med List Status: <None>   Medication Order Taking? Sig Documenting Provider Last Dose Status Informant  albuterol (VENTOLIN HFA) 108 (90 Base) MCG/ACT inhaler [id number] No Inhale 2 puffs into the lungs every 6 (six) hours as needed for wheezing or shortness of breath. Gilmore Laroche, FNP Taking Active   ondansetron (ZOFRAN-ODT) 4 MG disintegrating tablet [id number]  Take 1 tablet (4 mg total) by mouth every 8 (eight) hours as needed for nausea or vomiting. Valentino Nose, NP  Active             Home Care and Equipment/Supplies: Were Home Health Services Ordered?: No Any new equipment or medical supplies ordered?: No  Functional Questionnaire: Do you need assistance with bathing/showering or dressing?: No Do you need assistance with meal preparation?: No Do you need assistance with eating?: No Do you have difficulty maintaining continence: No Do you need assistance with getting out of bed/getting out of a chair/moving?: No Do you have difficulty managing or taking your medications?: No  Follow up appointments reviewed: PCP Follow-up appointment confirmed?:  Yes Date of PCP follow-up appointment?: 07/04/22 Follow-up Provider: [facility] Follow-up appointment confirmed?: Yes Date of Specialist follow-up appointment?: 07/16/22 Do you need transportation to your follow-up appointment?: No Do you understand care options if your condition(s) worsen?: Yes-patient verbalized understanding    SIGNATURE TB,CMA

## 2022-07-04 ENCOUNTER — Ambulatory Visit: Payer: No Typology Code available for payment source | Admitting: Family Medicine

## 2022-07-16 ENCOUNTER — Ambulatory Visit (INDEPENDENT_AMBULATORY_CARE_PROVIDER_SITE_OTHER): Payer: No Typology Code available for payment source | Admitting: Family Medicine

## 2022-07-16 ENCOUNTER — Ambulatory Visit: Payer: No Typology Code available for payment source | Admitting: Family Medicine

## 2022-07-16 VITALS — BP 121/87 | HR 80 | Ht 71.0 in | Wt 249.1 lb

## 2022-07-16 DIAGNOSIS — G44209 Tension-type headache, unspecified, not intractable: Secondary | ICD-10-CM | POA: Diagnosis not present

## 2022-07-16 DIAGNOSIS — F419 Anxiety disorder, unspecified: Secondary | ICD-10-CM | POA: Diagnosis not present

## 2022-07-16 DIAGNOSIS — R7301 Impaired fasting glucose: Secondary | ICD-10-CM

## 2022-07-16 DIAGNOSIS — E559 Vitamin D deficiency, unspecified: Secondary | ICD-10-CM

## 2022-07-16 DIAGNOSIS — E785 Hyperlipidemia, unspecified: Secondary | ICD-10-CM

## 2022-07-16 DIAGNOSIS — E1169 Type 2 diabetes mellitus with other specified complication: Secondary | ICD-10-CM

## 2022-07-16 DIAGNOSIS — E038 Other specified hypothyroidism: Secondary | ICD-10-CM

## 2022-07-16 DIAGNOSIS — G479 Sleep disorder, unspecified: Secondary | ICD-10-CM | POA: Diagnosis not present

## 2022-07-16 DIAGNOSIS — F1020 Alcohol dependence, uncomplicated: Secondary | ICD-10-CM | POA: Diagnosis not present

## 2022-07-16 MED ORDER — CYCLOBENZAPRINE HCL 5 MG PO TABS: 5.0000 mg | ORAL_TABLET | Freq: Every day | ORAL | 1 refills | Status: AC

## 2022-07-16 MED ORDER — HYDROXYZINE PAMOATE 25 MG PO CAPS: 25.0000 mg | ORAL_CAPSULE | Freq: Every evening | ORAL | 0 refills | Status: DC | PRN

## 2022-07-16 NOTE — Assessment & Plan Note (Signed)
GAD is 18 PHQ-9 is 18 Denies SI/HI Discussed nonpharmacologic ways to manage anxiety and depression  Patient and/or legal guardian verbally consented to [facility] services about presenting concerns and psychiatric consultation as appropriate.  The services will be billed as appropriate for the patient

## 2022-07-16 NOTE — Progress Notes (Signed)
Established Patient Office Visit  Subjective:  Patient ID: Charles Macias, male    DOB: 02-Jun-1985  Age: 37 y.o. MRN: [id number]  CC:  Chief Complaint  Patient presents with   Chronic Care Management    Follow up visit. States he sees cardiology on Wednesday, has been under stress due to job/ workers comp.     HPI Charles Macias is a 37 y.o. male with past medical history of vit d def presents for f/u of  chronic medical conditions. For the details of today's visit, please refer to the assessment and plan.     History reviewed. No pertinent past medical history.  History reviewed. No pertinent surgical history.  Family History  Problem Relation Age of Onset   COPD Mother    Lupus Mother    Heart attack Father    Heart attack Paternal Grandfather     Social History   Socioeconomic History   Marital status: Single    Spouse name: Not on file   Number of children: Not on file   Years of education: Not on file   Highest education level: Associate degree: occupational, Scientist, product/process development, or vocational program  Occupational History   Not on file  Tobacco Use   Smoking status: Every Day    Packs/day: .25    Types: Cigarettes   Smokeless tobacco: Never  Vaping Use   Vaping Use: Never used  Substance and Sexual Activity   Alcohol use: Yes    Comment: beer and liquor on weekends   Drug use: No   Sexual activity: Not on file  Other Topics Concern   Not on file  Social History Narrative   Not on file   Social Determinants of Health   Financial Resource Strain: Medium Risk (07/16/2022)   Overall Financial Resource Strain (CARDIA)    Difficulty of Paying Living Expenses: Somewhat hard  Food Insecurity: Food Insecurity Present (07/16/2022)   Hunger Vital Sign    Worried About Running Out of Food in the Last Year: Sometimes true    Ran Out of Food in the Last Year: Sometimes true  Transportation Needs: No Transportation Needs (07/16/2022)   PRAPARE - Therapist, art (Medical): No    Lack of Transportation (Non-Medical): No  Physical Activity: Unknown (07/16/2022)   Exercise Vital Sign    Days of Exercise per Week: 0 days    Minutes of Exercise per Session: Not on file  Stress: Stress Concern Present (07/16/2022)   [employer] of Occupational Health - Occupational Stress Questionnaire    Feeling of Stress : Very much  Social Connections: Socially Isolated (07/16/2022)   Social Connection and Isolation Panel [NHANES]    Frequency of Communication with Friends and Family: Three times a week    Frequency of Social Gatherings with Friends and Family: Once a week    Attends Religious Services: Never    Database administrator or Organizations: No    Attends Engineer, structural: Not on file    Marital Status: Never married  Intimate Partner Violence: Not on file    Outpatient Medications Prior to Visit  Medication Sig Dispense Refill   albuterol (VENTOLIN HFA) 108 (90 Base) MCG/ACT inhaler Inhale 2 puffs into the lungs every 6 (six) hours as needed for wheezing or shortness of breath. 8 g 0   ondansetron (ZOFRAN-ODT) 4 MG disintegrating tablet Take 1 tablet (4 mg total) by mouth every 8 (eight) hours as needed for  nausea or vomiting. 20 tablet 0   No facility-administered medications prior to visit.    No Known Allergies  ROS Review of Systems  Constitutional:  Negative for fatigue and fever.  Eyes:  Negative for visual disturbance.  Respiratory:  Negative for chest tightness and shortness of breath.   Cardiovascular:  Negative for chest pain and palpitations.  Neurological:  Negative for dizziness and headaches.      Objective:    Physical Exam HENT:     Head: Normocephalic.     Right Ear: External ear normal.     Left Ear: External ear normal.     Nose: No congestion or rhinorrhea.     Mouth/Throat:     Mouth: Mucous membranes are moist.  Cardiovascular:     Rate and Rhythm: Regular rhythm.      Heart sounds: No murmur heard. Pulmonary:     Effort: No respiratory distress.     Breath sounds: Normal breath sounds.  Neurological:     Mental Status: He is alert.     BP 121/87   Pulse 80   Ht 5\' 11"  (1.803 m)   Wt 249 lb 1.9 oz (113 kg)   SpO2 96%   BMI 34.75 kg/m  Wt Readings from Last 3 Encounters:  07/16/22 249 lb 1.9 oz (113 kg)  06/25/22 245 lb 2.4 oz (111.2 kg)  06/18/22 245 lb (111.1 kg)    Lab Results  Component Value Date   TSH 0.491 11/03/2021   Lab Results  Component Value Date   WBC 9.3 06/25/2022   HGB 16.7 06/25/2022   HCT 49.5 06/25/2022   MCV 93.0 06/25/2022   PLT 346 06/25/2022   Lab Results  Component Value Date   NA 137 06/25/2022   K 3.6 06/25/2022   CO2 26 06/25/2022   GLUCOSE 114 (H) 06/25/2022   BUN 13 06/25/2022   CREATININE 0.84 06/25/2022   BILITOT 0.6 11/03/2021   ALKPHOS 70 11/03/2021   AST 31 11/03/2021   ALT 20 11/03/2021   PROT 6.5 11/03/2021   ALBUMIN 4.2 11/03/2021   CALCIUM 8.8 (L) 06/25/2022   ANIONGAP 8 06/25/2022   EGFR 120 11/03/2021   Lab Results  Component Value Date   CHOL 191 11/03/2021   Lab Results  Component Value Date   HDL 36 (L) 11/03/2021   Lab Results  Component Value Date   LDLCALC 139 (H) 11/03/2021   Lab Results  Component Value Date   TRIG 87 11/03/2021   Lab Results  Component Value Date   CHOLHDL 5.3 (H) 11/03/2021   Lab Results  Component Value Date   HGBA1C 5.9 (H) 11/03/2021      Assessment & Plan:  Anxiety Assessment & Plan: GAD is 18 PHQ-9 is 18 Denies SI/HI Discussed nonpharmacologic ways to manage anxiety and depression  Patient and/or legal guardian verbally consented to [facility] services about presenting concerns and psychiatric consultation as appropriate.  The services will be billed as appropriate for the patient   Orders: -     Amb ref to Integrated Behavioral Health  [facility]) Assessment & Plan: Vit b1 for  prophylactic treatment Encouraged to continue AA classess   Tension headache -     Cyclobenzaprine HCl; Take 1 tablet (5 mg total) by mouth at bedtime.  Dispense: 30 tablet; Refill: 1 -     Vitamin B1  Sleep disturbance -     hydrOXYzine Pamoate; Take 1 capsule (25 mg total) by mouth  at bedtime as needed.  Dispense: 30 capsule; Refill: 0  IFG (impaired fasting glucose) -     Hemoglobin A1c -     Microalbumin / creatinine urine ratio  Vitamin D deficiency Assessment & Plan: Encouraged to continue taking Vit D supplement weekly  Orders: -     VITAMIN D 25 Hydroxy (Vit-D Deficiency, Fractures)  Other specified hypothyroidism -     TSH + free T4  Hyperlipidemia associated with type 2 diabetes mellitus (HCC) -     Lipid panel -     CMP14+EGFR -     CBC with Differential/Platelet    Follow-up: Return in about 3 months (around 10/16/2022).   Gilmore Laroche, FNP

## 2022-07-16 NOTE — Assessment & Plan Note (Signed)
Vit b1 for prophylactic treatment Encouraged to continue AA classess

## 2022-07-16 NOTE — Patient Instructions (Addendum)
I appreciate the opportunity to provide care to you today!    Follow up:  3 months  Labs: please stop by the lab today the week to get your blood drawn (CBC, CMP, TSH, Lipid profile, HgA1c, Vit D)   Referrals today- integrated behavioral health  Attached with your AVS, you will find valuable resources for self-education. I highly recommend dedicating some time to thoroughly examine them.   Please continue to a heart-healthy diet and increase your physical activities. Try to exercise for at least five days a week.    It was a pleasure to see you and I look forward to continuing to work together on your health and well-being. Please do not hesitate to call the office if you need care or have questions about your care.  In case of emergency, please visit the Emergency Department for urgent care, or contact our clinic at [phone] to schedule an appointment. We're here to help you!   Have a wonderful day and week. With Gratitude, Gilmore Laroche MSN, FNP-BC

## 2022-07-16 NOTE — Assessment & Plan Note (Signed)
Encouraged to continue taking Vit D supplement weekly

## 2022-07-17 ENCOUNTER — Encounter: Payer: Self-pay | Admitting: *Deleted

## 2022-07-17 LAB — LIPID PANEL: LDL Chol Calc (NIH): 157 mg/dL — ABNORMAL HIGH (ref 0–99)

## 2022-07-17 LAB — CBC WITH DIFFERENTIAL/PLATELET
Immature Grans (Abs): 0 10*3/uL (ref 0.0–0.1)
MCH: 31.4 pg (ref 26.6–33.0)
MCHC: 34.6 g/dL (ref 31.5–35.7)
Monocytes: 6 %
RDW: 13.2 % (ref 11.6–15.4)
WBC: 7.6 10*3/uL (ref 3.4–10.8)

## 2022-07-17 LAB — CMP14+EGFR
ALT: 24 IU/L (ref 0–44)
AST: 31 IU/L (ref 0–40)
Calcium: 9.5 mg/dL (ref 8.7–10.2)
Creatinine, Ser: 0.76 mg/dL (ref 0.76–1.27)
Potassium: 4.9 mmol/L (ref 3.5–5.2)
Sodium: 138 mmol/L (ref 134–144)

## 2022-07-17 LAB — HEMOGLOBIN A1C

## 2022-07-17 LAB — TSH+FREE T4: TSH: 0.766 u[IU]/mL (ref 0.450–4.500)

## 2022-07-17 NOTE — Progress Notes (Signed)
Please encourage the patient to continue taking his weekly vitamin D supplement.  His vitamin D is low.  His cholesterol levels are elevated.  I recommend taking over-the-counter fish oil 2000 mg twice daily. I recommend avoiding simple carbohydrates, including cakes, sweet desserts, ice cream, soda (diet or regular), sweet tea, candies, chips, cookies, store-bought juices, alcohol in excess of 1-2 drinks a day, lemonade, artificial sweeteners, donuts, coffee creamers, and sugar-free products.  I recommend avoiding greasy, fatty foods with increased physical activity.

## 2022-07-18 ENCOUNTER — Ambulatory Visit: Payer: No Typology Code available for payment source | Attending: Internal Medicine | Admitting: Internal Medicine

## 2022-07-18 ENCOUNTER — Other Ambulatory Visit: Payer: Self-pay | Admitting: Family Medicine

## 2022-07-18 ENCOUNTER — Encounter: Payer: Self-pay | Admitting: Internal Medicine

## 2022-07-18 ENCOUNTER — Encounter: Payer: Self-pay | Admitting: Family Medicine

## 2022-07-18 VITALS — BP 124/78 | HR 93 | Ht 71.0 in | Wt 246.0 lb

## 2022-07-18 DIAGNOSIS — Z8249 Family history of ischemic heart disease and other diseases of the circulatory system: Secondary | ICD-10-CM | POA: Diagnosis not present

## 2022-07-18 DIAGNOSIS — R0789 Other chest pain: Secondary | ICD-10-CM | POA: Diagnosis not present

## 2022-07-18 DIAGNOSIS — F419 Anxiety disorder, unspecified: Secondary | ICD-10-CM

## 2022-07-18 DIAGNOSIS — E7849 Other hyperlipidemia: Secondary | ICD-10-CM | POA: Diagnosis not present

## 2022-07-18 DIAGNOSIS — E785 Hyperlipidemia, unspecified: Secondary | ICD-10-CM | POA: Insufficient documentation

## 2022-07-18 LAB — HEMOGLOBIN A1C: Est. average glucose Bld gHb Est-mCnc: 126 mg/dL

## 2022-07-18 LAB — CBC WITH DIFFERENTIAL/PLATELET
Eos: 6 %
Hematocrit: 47.7 % (ref 37.5–51.0)
Hemoglobin: 16.5 g/dL (ref 13.0–17.7)
Lymphs: 35 %
MCV: 91 fL (ref 79–97)
Platelets: 338 10*3/uL (ref 150–450)

## 2022-07-18 LAB — TSH+FREE T4: Free T4: 1.31 ng/dL (ref 0.82–1.77)

## 2022-07-18 LAB — CMP14+EGFR
Albumin: 4.4 g/dL (ref 4.1–5.1)
Globulin, Total: 3 g/dL (ref 1.5–4.5)
Glucose: 90 mg/dL (ref 70–99)
eGFR: 119 mL/min/{1.73_m2} (ref >59)

## 2022-07-18 LAB — LIPID PANEL
HDL: 39 mg/dL — ABNORMAL LOW (ref >39)
Triglycerides: 72 mg/dL (ref 0–149)
VLDL Cholesterol Cal: 13 mg/dL (ref 5–40)

## 2022-07-18 LAB — VITAMIN B1

## 2022-07-18 NOTE — Patient Instructions (Addendum)
Medication Instructions:  Your physician recommends that you continue on your current medications as directed. Please refer to the Current Medication list given to you today.   Labwork: Lipoprotein-a to be done today here at [facility]  Testing/Procedures: Your physician has requested that you have cardiac CT. Cardiac computed tomography (CT) is a painless test that uses an x-ray machine to take clear, detailed pictures of your heart. For further information please visit [email or web address] Please follow instruction sheet as given.    Follow-Up: Your physician recommends that you schedule a follow-up appointment in: Pending Results  Any Other Special Instructions Will Be Listed Below (If Applicable).  If you need a refill on your cardiac medications before your next appointment, please call your pharmacy.

## 2022-07-18 NOTE — Progress Notes (Signed)
Cardiology Office Note  Date: 07/18/2022   ID: Charles Macias, DOB 1985-03-19, MRN [id number]  PCP:  Gilmore Laroche, FNP  Cardiologist:  Marjo Bicker, MD Electrophysiologist:  None   Reason for Office Visit: Chest pain evaluation  History of Present Illness: Charles Macias is a 37 y.o. male known to have HLD, anxiety was referred to cardiology clinic for evaluation of chest pain.  Patient had ER visit on 06/25/2022 for chest pain that started after he woke up from sleep.  It radiated to his left arm which scared him. He has a family history of premature CAD (father had bypass surgery done in his early 64s).  Due to family history and chest pains, he had ER visit on 06/25/2022.  EKG showed no ischemia and troponins were within normal limits.  He was discharged to see cardiology in the clinic.  He reports having chest pains radiating to the left arm, twice per month, occurs at rest and not with exertion, started around couple of months ago.  He does not think this is from anxiety.  He does not have any other symptoms of orthopnea, PND, leg swelling.  No DOE.  No dizziness, palpitations.  He gained significant weight in the last several months as he was not exercising due to knee pain.   Past Medical History:  Diagnosis Date   Alcoholism (HCC)    Anxiety    Hyperlipidemia     History reviewed. No pertinent surgical history.  Current Outpatient Medications  Medication Sig Dispense Refill   albuterol (VENTOLIN HFA) 108 (90 Base) MCG/ACT inhaler Inhale 2 puffs into the lungs every 6 (six) hours as needed for wheezing or shortness of breath. 8 g 0   cyclobenzaprine (FLEXERIL) 5 MG tablet Take 1 tablet (5 mg total) by mouth at bedtime. 30 tablet 1   hydrOXYzine (VISTARIL) 25 MG capsule Take 1 capsule (25 mg total) by mouth at bedtime as needed. 30 capsule 0   No current facility-administered medications for this visit.   Allergies:  Patient has no known allergies.    Social History: The patient  reports that he has been smoking cigarettes. He has been smoking an average of .25 packs per day. He has never used smokeless tobacco. He reports current alcohol use. He reports that he does not use drugs.   Family History: The patient's family history includes COPD in his mother; Heart attack in his father and paternal grandfather; Lupus in his mother; Other in his father, maternal grandfather, mother, and paternal grandfather.   ROS:  Please see the history of present illness. Otherwise, complete review of systems is positive for none.  All other systems are reviewed and negative.   Physical Exam: VS:  BP 124/78   Pulse 93   Ht 5\' 11"  (1.803 m)   Wt 246 lb (111.6 kg)   SpO2 95%   BMI 34.31 kg/m , BMI Body mass index is 34.31 kg/m.  Wt Readings from Last 3 Encounters:  07/18/22 246 lb (111.6 kg)  07/16/22 249 lb 1.9 oz (113 kg)  06/25/22 245 lb 2.4 oz (111.2 kg)    General: Patient appears comfortable at rest. HEENT: Conjunctiva and lids normal, oropharynx clear with moist mucosa. Neck: Supple, no elevated JVP or carotid bruits, no thyromegaly. Lungs: Clear to auscultation, nonlabored breathing at rest. Cardiac: Regular rate and rhythm, no S3 or significant systolic murmur, no pericardial rub. Abdomen: Soft, nontender, no hepatomegaly, bowel sounds present, no guarding or rebound. Extremities: No  pitting edema, distal pulses 2+. Skin: Warm and dry. Musculoskeletal: No kyphosis. Neuropsychiatric: Alert and oriented x3, affect grossly appropriate.  Recent Labwork: 07/16/2022: ALT 24; AST 31; BUN 10; Creatinine, Ser 0.76; Hemoglobin 16.5; Platelets 338; Potassium 4.9; Sodium 138; TSH 0.766     Component Value Date/Time   CHOL 209 (H) 07/16/2022 1406   TRIG 72 07/16/2022 1406   HDL 39 (L) 07/16/2022 1406   CHOLHDL 5.4 (H) 07/16/2022 1406   LDLCALC 157 (H) 07/16/2022 1406    Other Studies Reviewed Today:   Assessment and Plan:  # Noncardiac  chest pain # Family history of premature CAD (Father had PCI/CABG in early 81s) # Hyperlipidemia -Patient has chest pain radiating to the left arm occurring twice per month and ongoing for the last couple of months. No chest pain with exertion but not physically active recently due to knee pain.  I reviewed her lipid panel from 7/24 that showed normal TG and elevated LDL 157. LDL has been progressively elevated in the last 1 year.  Not exercising due to knee pain. Does not want to take statins at this time. Recommended over-the-counter red yeast rice.  Obtain CT calcium scoring of coronaries and lipoprotein a levels.  Check blood pressures at home daily, in a.m. and p.m. to rule out hypertension at this could be the reason for his resting chest pains. He also reported having HTN in the past but not on any medications.   I have spent a total of 45 minutes with patient reviewing chart, EKGs, labs and examining patient as well as establishing an assessment and plan that was discussed with the patient.  > 50% of time was spent in direct patient care.    Medication Adjustments/Labs and Tests Ordered: Current medicines are reviewed at length with the patient today.  Concerns regarding medicines are outlined above.   Tests Ordered: Orders Placed This Encounter  Procedures   CT CARDIAC SCORING (SELF PAY ONLY)   Lipoprotein A (LPA)    Medication Changes: No orders of the defined types were placed in this encounter.   Disposition:  Follow up  pending results  Signed, Jaylenn Altier Verne Spurr, MD, 07/18/2022 4:04 PM    [facility] at [facility] [address], [place], [place] [address]

## 2022-07-19 LAB — CBC WITH DIFFERENTIAL/PLATELET
Basophils Absolute: 0.1 10*3/uL (ref 0.0–0.2)
Basos: 2 %
EOS (ABSOLUTE): 0.5 10*3/uL — ABNORMAL HIGH (ref 0.0–0.4)
Immature Granulocytes: 0 %
Lymphocytes Absolute: 2.6 10*3/uL (ref 0.7–3.1)
Monocytes Absolute: 0.4 10*3/uL (ref 0.1–0.9)
Neutrophils Absolute: 3.9 10*3/uL (ref 1.4–7.0)
Neutrophils: 51 %
RBC: 5.25 x10E6/uL (ref 4.14–5.80)

## 2022-07-19 LAB — CMP14+EGFR
Alkaline Phosphatase: 89 IU/L (ref 44–121)
BUN/Creatinine Ratio: 13 (ref 9–20)
BUN: 10 mg/dL (ref 6–20)
Bilirubin Total: <0.2 mg/dL (ref 0.0–1.2)
CO2: 21 mmol/L (ref 20–29)
Chloride: 103 mmol/L (ref 96–106)
Total Protein: 7.4 g/dL (ref 6.0–8.5)

## 2022-07-19 LAB — LIPID PANEL
Chol/HDL Ratio: 5.4 ratio — ABNORMAL HIGH (ref 0.0–5.0)
Cholesterol, Total: 209 mg/dL — ABNORMAL HIGH (ref 100–199)

## 2022-07-19 LAB — VITAMIN D 25 HYDROXY (VIT D DEFICIENCY, FRACTURES): Vit D, 25-Hydroxy: 19.4 ng/mL — ABNORMAL LOW (ref 30.0–100.0)

## 2022-07-23 ENCOUNTER — Ambulatory Visit: Payer: No Typology Code available for payment source | Admitting: Family Medicine

## 2022-07-26 ENCOUNTER — Ambulatory Visit (INDEPENDENT_AMBULATORY_CARE_PROVIDER_SITE_OTHER): Payer: No Typology Code available for payment source | Admitting: Licensed Clinical Social Worker

## 2022-07-26 DIAGNOSIS — F4321 Adjustment disorder with depressed mood: Secondary | ICD-10-CM | POA: Diagnosis not present

## 2022-07-27 NOTE — BH Specialist Note (Signed)
Integrated Behavioral Health via Telemedicine Visit  07/27/2022 ROBERTA KELLY [id number]  Number of Integrated Behavioral Health Clinician visits:  Session Start time:  230pm Session End time: 249pm Total time in minutes: 19 mins via mychart video  Referring Provider: Sharen Hones NP Patient/Family location: Home  [facility] Provider location: [facility]  All persons participating in visit: Pt C Eisenstein and LCSW A Mateo Overbeck  Types of Service: Video visit and General Behavioral Integrated Care (BHI)  I connected with Georgiann Mccoy and/or Ernst Bowler Mcauliff's n/a via  Telephone or [employer]  (Video is [employer]) and verified that I am speaking with the correct person using two identifiers. Discussed confidentiality: Yes   I discussed the limitations of telemedicine and the availability of in person appointments.  Discussed there is a possibility of technology failure and discussed alternative modes of communication if that failure occurs.  I discussed that engaging in this telemedicine visit, they consent to the provision of behavioral healthcare and the services will be billed under their insurance.  Patient and/or legal guardian expressed understanding and consented to Telemedicine visit: Yes   Presenting Concerns: Patient and/or family reports the following symptoms/concerns: adjustment disorder with depressed mood  Duration of problem: approx one month; Severity of problem: mild  Patient and/or Family's Strengths/Protective Factors: Concrete supports in place (healthy food, safe environments, etc.)  Goals Addressed: Patient will:  Reduce symptoms of: depression   Increase knowledge and/or ability of: coping skills   Demonstrate ability to: Increase healthy adjustment to current life circumstances  Progress towards Goals: Ongoing  Interventions: Interventions utilized:  Supportive Counseling Standardized Assessments completed:  Not Needed  Patient and/or Family Response: Mr. Parenteau reports depressed mood approx one month due to work injury and not being able to return to work   Assessment: Patient currently experiencing adjustment disorder with depressed mood.   Patient may benefit from integrated behavioral health.  Plan: Follow up with behavioral health clinician on : as needed  Behavioral recommendations: n/a Referral(s): Integrated [employer] (In Clinic)  I discussed the assessment and treatment plan with the patient and/or parent/guardian. They were provided an opportunity to ask questions and all were answered. They agreed with the plan and demonstrated an understanding of the instructions.   They were advised to call back or seek an in-person evaluation if the symptoms worsen or if the condition fails to improve as anticipated.  Gwyndolyn Saxon, LCSW

## 2022-08-17 ENCOUNTER — Other Ambulatory Visit (HOSPITAL_COMMUNITY): Payer: No Typology Code available for payment source

## 2022-09-07 ENCOUNTER — Institutional Professional Consult (permissible substitution): Payer: Self-pay | Admitting: Professional Counselor

## 2022-10-08 ENCOUNTER — Emergency Department (HOSPITAL_COMMUNITY)
Admission: EM | Admit: 2022-10-08 | Discharge: 2022-10-08 | Discharge status: Against Medical Advice | Payer: Self-pay | Source: Home / Self Care

## 2022-10-09 ENCOUNTER — Emergency Department (HOSPITAL_COMMUNITY)
Admission: EM | Admit: 2022-10-09 | Discharge: 2022-10-09 | Disposition: A | Discharge status: Home / Self Care | Payer: Self-pay | Attending: Emergency Medicine | Admitting: Emergency Medicine

## 2022-10-09 ENCOUNTER — Other Ambulatory Visit: Payer: Self-pay

## 2022-10-09 ENCOUNTER — Encounter (HOSPITAL_COMMUNITY): Payer: Self-pay

## 2022-10-09 ENCOUNTER — Emergency Department (HOSPITAL_COMMUNITY): Payer: Self-pay

## 2022-10-09 DIAGNOSIS — J069 Acute upper respiratory infection, unspecified: Secondary | ICD-10-CM | POA: Insufficient documentation

## 2022-10-09 DIAGNOSIS — Z20822 Contact with and (suspected) exposure to covid-19: Secondary | ICD-10-CM | POA: Insufficient documentation

## 2022-10-09 LAB — SARS CORONAVIRUS 2 BY RT PCR: SARS Coronavirus 2 by RT PCR: NEGATIVE

## 2022-10-09 LAB — BASIC METABOLIC PANEL
Anion gap: 9 (ref 5–15)
BUN: 7 mg/dL (ref 6–20)
CO2: 22 mmol/L (ref 22–32)
Calcium: 8.6 mg/dL — ABNORMAL LOW (ref 8.9–10.3)
Chloride: 105 mmol/L (ref 98–111)
Creatinine, Ser: 0.8 mg/dL (ref 0.61–1.24)
GFR, Estimated: >60 mL/min (ref >60)
Glucose, Bld: 107 mg/dL — ABNORMAL HIGH (ref 70–99)
Potassium: 4.2 mmol/L (ref 3.5–5.1)
Sodium: 136 mmol/L (ref 135–145)

## 2022-10-09 LAB — CBC WITH DIFFERENTIAL/PLATELET
Abs Immature Granulocytes: 0.03 10*3/uL (ref 0.00–0.07)
Basophils Absolute: 0.1 10*3/uL (ref 0.0–0.1)
Basophils Relative: 1 %
Eosinophils Absolute: 0.4 10*3/uL (ref 0.0–0.5)
Eosinophils Relative: 6 %
HCT: 45.3 % (ref 39.0–52.0)
Hemoglobin: 15 g/dL (ref 13.0–17.0)
Immature Granulocytes: 0 %
Lymphocytes Relative: 39 %
Lymphs Abs: 2.7 10*3/uL (ref 0.7–4.0)
MCH: 31.8 pg (ref 26.0–34.0)
MCHC: 33.1 g/dL (ref 30.0–36.0)
MCV: 96 fL (ref 80.0–100.0)
Monocytes Absolute: 0.6 10*3/uL (ref 0.1–1.0)
Monocytes Relative: 9 %
Neutro Abs: 3.2 10*3/uL (ref 1.7–7.7)
Neutrophils Relative %: 45 %
Platelets: 331 10*3/uL (ref 150–400)
RBC: 4.72 MIL/uL (ref 4.22–5.81)
RDW: 13.7 % (ref 11.5–15.5)
WBC: 7 10*3/uL (ref 4.0–10.5)
nRBC: 0 % (ref 0.0–0.2)

## 2022-10-09 LAB — GROUP A STREP BY PCR: Group A Strep by PCR: NOT DETECTED

## 2022-10-09 MED ORDER — IBUPROFEN 400 MG PO TABS
600.0000 mg | ORAL_TABLET | Freq: Once | ORAL | Status: AC
  Administered 2022-10-09: 600 mg via ORAL
  Filled 2022-10-09: qty 2

## 2022-10-09 MED ORDER — BENZONATATE 100 MG PO CAPS
100.0000 mg | ORAL_CAPSULE | Freq: Three times a day (TID) | ORAL | 0 refills | Status: AC
Start: 2022-10-09 — End: ?

## 2022-10-09 NOTE — Discharge Instructions (Addendum)
It was a pleasure taking care of you today.  Your symptoms are consistent with a viral illness.  Your COVID test was negative, your strep test was also negative.  Your CBC and BMP were also reassuring.  Your kidney function is normal.  Chest x-ray is normal you can take over-the-counter Tylenol and ibuprofen alternated every 4 hours as needed for body aches and fever.  Drink plenty of fluids and rest.

## 2022-10-09 NOTE — ED Provider Notes (Signed)
EMERGENCY DEPARTMENT AT [facility] Provider Note   CSN: [id number] Arrival date & time: 10/09/22  6440     History  Chief Complaint  Patient presents with   Cough    Charles Macias is a 37 y.o. male.  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  Presents ER today for 3 days of nonproductive cough cough, fatigue congestion and intermittent fever up to 102 Fahrenheit at home.  He has tried over-the-counter children's Sudafed, Alka-Seltzer and vitamin C supplement without relief.  States he is "just not feeling good ".  He states that started he was very fatigued and drank multiple energy drinks without relief.  He has not taken medications this morning, no abdominal pain nausea or vomiting, no dysuria or hematuria, no back or flank pain.  States he does have soreness all over his chest when he coughs.  No shortness of breath.  No chest pain otherwise.  No headache or dizziness.   Cough      Home Medications Prior to Admission medications   Medication Sig Start Date End Date Taking? Authorizing Provider  benzonatate (TESSALON) 100 MG capsule Take 1 capsule (100 mg total) by mouth every 8 (eight) hours. 10/09/22  Yes Alainna Stawicki A, PA-C  albuterol (VENTOLIN HFA) 108 (90 Base) MCG/ACT inhaler Inhale 2 puffs into the lungs every 6 (six) hours as needed for wheezing or shortness of breath. 01/12/22   Gilmore Laroche, FNP  cyclobenzaprine (FLEXERIL) 5 MG tablet Take 1 tablet (5 mg total) by mouth at bedtime. 07/16/22   Gilmore Laroche, FNP  hydrOXYzine (VISTARIL) 25 MG capsule Take 1 capsule (25 mg total) by mouth at bedtime as needed. 07/16/22   Gilmore Laroche, FNP      Allergies    Patient has no known allergies.    Review of Systems   Review of Systems  Respiratory:  Positive for cough.     Physical Exam Updated Vital Signs BP 124/83   Pulse 88   Temp 98.1 F (36.7 C)   Resp 17   Ht 5\' 11"  (1.803 m)   Wt 102.1 kg   SpO2 95%   BMI 31.38  kg/m  Physical Exam Vitals and nursing note reviewed.  Constitutional:      General: He is not in acute distress.    Appearance: He is well-developed.  HENT:     Head: Normocephalic and atraumatic.     Right Ear: Tympanic membrane normal.     Left Ear: Tympanic membrane normal.     Mouth/Throat:     Lips: Pink.     Mouth: Mucous membranes are moist. No oral lesions.     Pharynx: Oropharynx is clear. Uvula midline. Posterior oropharyngeal erythema present. No pharyngeal swelling or oropharyngeal exudate.     Tonsils: No tonsillar exudate or tonsillar abscesses.  Eyes:     Conjunctiva/sclera: Conjunctivae normal.  Cardiovascular:     Rate and Rhythm: Normal rate and regular rhythm.     Heart sounds: No murmur heard. Pulmonary:     Effort: Pulmonary effort is normal. No respiratory distress.     Breath sounds: Normal breath sounds.  Chest:     Chest wall: Tenderness present. No crepitus.    Abdominal:     Palpations: Abdomen is soft.     Tenderness: There is no abdominal tenderness.  Musculoskeletal:        General: No swelling.     Cervical back: Neck supple.  Skin:  General: Skin is warm and dry.     Capillary Refill: Capillary refill takes less than 2 seconds.  Neurological:     Mental Status: He is alert.  Psychiatric:        Mood and Affect: Mood normal.     ED Results / Procedures / Treatments   Labs (all labs ordered are listed, but only abnormal results are displayed) Labs Reviewed  BASIC METABOLIC PANEL - Abnormal; Notable for the following components:      Result Value   Glucose, Bld 107 (*)    Calcium 8.6 (*)    All other components within normal limits  GROUP A STREP BY PCR  SARS CORONAVIRUS 2 BY RT PCR  CBC WITH DIFFERENTIAL/PLATELET    EKG None  Radiology DG Chest 2 View  Result Date: 10/09/2022 CLINICAL DATA:  cough, fever. EXAM: CHEST - 2 VIEW COMPARISON:  06/25/2022. FINDINGS: Bilateral lung fields are clear. Bilateral costophrenic  angles are clear. Normal cardio-mediastinal silhouette. No acute osseous abnormalities. The soft tissues are within normal limits. IMPRESSION: No active cardiopulmonary disease. Electronically Signed   By: Jules Schick M.D.   On: 10/09/2022 10:20    Procedures Procedures    Medications Ordered in ED Medications  ibuprofen (ADVIL) tablet 600 mg (600 mg Oral Given 10/09/22 0935)    ED Course/ Medical Decision Making/ A&P Clinical Course as of 10/09/22 0928  Tue Oct 09, 2022  0927 Presents with several days of fatigue, URI symptoms and intermittent fevers, no fever today and has not taken any products today.  He is nontoxic in appearance.  He specifically requesting blood work to "check my kidneys" states he worries about his kidney function as his doctor has mentioned to somebody that they need to watch.  He denies urinary complaints or decreased urinary output.  COVID and strep swabs in process.  Given intermittent fevers and chills and chest tenderness with fairly constant, will also get chest x-ray to evaluate for possible pneumonia and basic labs [CB]    Clinical Course User Index [CB] Ma Rings, PA-C                                 Medical Decision Making Differential diagnosis includes but is not limited to pneumonia, COVID, influenza, viral URI, strep pharyngitis, otitis media, sepsis, other  ED course: As above patient here with URI symptoms and fatigue, requesting labs which are reassuring, chest x-ray normal, patient is well-appearing nontoxic no signs of bacterial illness, no SIRS criteria met in the ED.  Fever was last night, has not taken any antipyretics today.  This is a patient likely viral illness, treat with supportive measures, he is having cough so prescribe Tessalon Perles, advised on alternating Tylenol Motrin, drink plenty of fluids and rest, given strict return precautions.   Amount and/or Complexity of Data Reviewed Labs: ordered.    Details:  COVID-negative, strep negative, CBC is normal specifically no leukocytosis no anemia, BMP has normal renal function, significant finding is mildly hypocalcemia Radiology: ordered and independent interpretation performed.    Details: Chest x-ray shows no pulmonary edema, no infiltrate, no pneumothorax.  I agree with radiology read  Risk Prescription drug management.           Final Clinical Impression(s) / ED Diagnoses Final diagnoses:  Upper respiratory tract infection, unspecified type    Rx / DC Orders ED Discharge Orders  Ordered    benzonatate (TESSALON) 100 MG capsule  Every 8 hours        10/09/22 [address] 10/09/22 1122    Bethann Berkshire, MD 10/11/22 [phone]

## 2022-10-09 NOTE — ED Triage Notes (Signed)
Pt c/o cough, nasal congestion, HTN,  lethargic since Sunday. Pt stated he had a fever of 102.0 yesterday and has been taking OTC meds.

## 2022-10-16 ENCOUNTER — Ambulatory Visit: Payer: No Typology Code available for payment source | Admitting: Family Medicine

## 2022-11-19 ENCOUNTER — Other Ambulatory Visit: Payer: Self-pay | Admitting: Family Medicine

## 2022-11-19 DIAGNOSIS — G479 Sleep disorder, unspecified: Secondary | ICD-10-CM
